# Patient Record
Sex: Male | Born: 1935 | Race: White | Hispanic: No | Marital: Married
Health system: Southern US, Community
[De-identification: ages and names within clinical notes are randomized; demographics above are authoritative.]

---

## 2016-12-09 ENCOUNTER — Inpatient Hospital Stay
Admission: RE | Admit: 2016-12-09 | Discharge: 2017-01-03 | Disposition: A | Discharge status: Hospice | Payer: Self-pay | Source: Ambulatory Visit | Attending: Internal Medicine | Admitting: Internal Medicine

## 2016-12-09 ENCOUNTER — Other Ambulatory Visit (HOSPITAL_COMMUNITY): Payer: Self-pay

## 2016-12-09 DIAGNOSIS — Z431 Encounter for attention to gastrostomy: Secondary | ICD-10-CM

## 2016-12-09 DIAGNOSIS — Z931 Gastrostomy status: Secondary | ICD-10-CM

## 2016-12-09 DIAGNOSIS — Z4659 Encounter for fitting and adjustment of other gastrointestinal appliance and device: Secondary | ICD-10-CM

## 2016-12-09 DIAGNOSIS — L0291 Cutaneous abscess, unspecified: Secondary | ICD-10-CM

## 2016-12-09 DIAGNOSIS — N186 End stage renal disease: Secondary | ICD-10-CM

## 2016-12-09 DIAGNOSIS — Z992 Dependence on renal dialysis: Secondary | ICD-10-CM

## 2016-12-09 DIAGNOSIS — Z452 Encounter for adjustment and management of vascular access device: Secondary | ICD-10-CM

## 2016-12-09 DIAGNOSIS — R140 Abdominal distension (gaseous): Secondary | ICD-10-CM

## 2016-12-09 DIAGNOSIS — K6812 Psoas muscle abscess: Secondary | ICD-10-CM

## 2016-12-09 DIAGNOSIS — R188 Other ascites: Secondary | ICD-10-CM

## 2016-12-09 DIAGNOSIS — J9690 Respiratory failure, unspecified, unspecified whether with hypoxia or hypercapnia: Secondary | ICD-10-CM

## 2016-12-09 DIAGNOSIS — I639 Cerebral infarction, unspecified: Secondary | ICD-10-CM

## 2016-12-09 MED ORDER — IOPAMIDOL (ISOVUE-300) INJECTION 61%
INTRAVENOUS | Status: AC
  Administered 2016-12-09: 50 mL via GASTROSTOMY
  Filled 2016-12-09: qty 50

## 2016-12-10 LAB — CBC WITH DIFFERENTIAL/PLATELET
Basophils Absolute: 0.1 10*3/uL (ref 0.0–0.1)
Basophils Relative: 0 %
EOS ABS: 1.9 10*3/uL — AB (ref 0.0–0.7)
EOS PCT: 12 %
HCT: 30.5 % — ABNORMAL LOW (ref 39.0–52.0)
Hemoglobin: 9.2 g/dL — ABNORMAL LOW (ref 13.0–17.0)
LYMPHS ABS: 1.2 10*3/uL (ref 0.7–4.0)
Lymphocytes Relative: 8 %
MCH: 27.8 pg (ref 26.0–34.0)
MCHC: 30.2 g/dL (ref 30.0–36.0)
MCV: 92.1 fL (ref 78.0–100.0)
MONO ABS: 0.8 10*3/uL (ref 0.1–1.0)
MONOS PCT: 5 %
Neutro Abs: 11.4 10*3/uL — ABNORMAL HIGH (ref 1.7–7.7)
Neutrophils Relative %: 75 %
PLATELETS: 258 10*3/uL (ref 150–400)
RBC: 3.31 MIL/uL — ABNORMAL LOW (ref 4.22–5.81)
RDW: 14.4 % (ref 11.5–15.5)
WBC: 15.4 10*3/uL — ABNORMAL HIGH (ref 4.0–10.5)

## 2016-12-10 LAB — BASIC METABOLIC PANEL
Anion gap: 8 (ref 5–15)
BUN: 25 mg/dL — AB (ref 6–20)
CHLORIDE: 121 mmol/L — AB (ref 101–111)
CO2: 20 mmol/L — ABNORMAL LOW (ref 22–32)
CREATININE: 1.13 mg/dL (ref 0.61–1.24)
Calcium: 8.6 mg/dL — ABNORMAL LOW (ref 8.9–10.3)
GFR calc Af Amer: >60 mL/min (ref >60)
GFR calc non Af Amer: 59 mL/min — ABNORMAL LOW (ref >60)
Glucose, Bld: 146 mg/dL — ABNORMAL HIGH (ref 65–99)
Potassium: 3.2 mmol/L — ABNORMAL LOW (ref 3.5–5.1)
Sodium: 149 mmol/L — ABNORMAL HIGH (ref 135–145)

## 2016-12-11 LAB — CBC
HEMATOCRIT: 35.6 % — AB (ref 39.0–52.0)
Hemoglobin: 10.8 g/dL — ABNORMAL LOW (ref 13.0–17.0)
MCH: 29.1 pg (ref 26.0–34.0)
MCHC: 30.3 g/dL (ref 30.0–36.0)
MCV: 96 fL (ref 78.0–100.0)
Platelets: 258 10*3/uL (ref 150–400)
RBC: 3.71 MIL/uL — ABNORMAL LOW (ref 4.22–5.81)
RDW: 15.1 % (ref 11.5–15.5)
WBC: 18 10*3/uL — AB (ref 4.0–10.5)

## 2016-12-11 LAB — OCCULT BLOOD X 1 CARD TO LAB, STOOL: Fecal Occult Bld: NEGATIVE

## 2016-12-11 LAB — BASIC METABOLIC PANEL
ANION GAP: 9 (ref 5–15)
BUN: 28 mg/dL — ABNORMAL HIGH (ref 6–20)
CALCIUM: 9 mg/dL (ref 8.9–10.3)
CO2: 17 mmol/L — AB (ref 22–32)
Chloride: 121 mmol/L — ABNORMAL HIGH (ref 101–111)
Creatinine, Ser: 1.2 mg/dL (ref 0.61–1.24)
GFR calc Af Amer: >60 mL/min (ref >60)
GFR calc non Af Amer: 54 mL/min — ABNORMAL LOW (ref >60)
GLUCOSE: 140 mg/dL — AB (ref 65–99)
POTASSIUM: 4.7 mmol/L (ref 3.5–5.1)
Sodium: 147 mmol/L — ABNORMAL HIGH (ref 135–145)

## 2016-12-11 LAB — HEMOGLOBIN A1C
HEMOGLOBIN A1C: 7.1 % — AB (ref 4.8–5.6)
Mean Plasma Glucose: 157 mg/dL

## 2016-12-12 LAB — BASIC METABOLIC PANEL
ANION GAP: 8 (ref 5–15)
BUN: 44 mg/dL — ABNORMAL HIGH (ref 6–20)
CALCIUM: 9.1 mg/dL (ref 8.9–10.3)
CO2: 16 mmol/L — AB (ref 22–32)
Chloride: 119 mmol/L — ABNORMAL HIGH (ref 101–111)
Creatinine, Ser: 1.68 mg/dL — ABNORMAL HIGH (ref 0.61–1.24)
GFR, EST AFRICAN AMERICAN: 42 mL/min — AB (ref >60)
GFR, EST NON AFRICAN AMERICAN: 36 mL/min — AB (ref >60)
GLUCOSE: 236 mg/dL — AB (ref 65–99)
POTASSIUM: 4.9 mmol/L (ref 3.5–5.1)
Sodium: 143 mmol/L (ref 135–145)

## 2016-12-12 LAB — CBC
HEMATOCRIT: 33.9 % — AB (ref 39.0–52.0)
HEMOGLOBIN: 10.2 g/dL — AB (ref 13.0–17.0)
MCH: 28.2 pg (ref 26.0–34.0)
MCHC: 30.1 g/dL (ref 30.0–36.0)
MCV: 93.6 fL (ref 78.0–100.0)
Platelets: 249 10*3/uL (ref 150–400)
RBC: 3.62 MIL/uL — AB (ref 4.22–5.81)
RDW: 14.9 % (ref 11.5–15.5)
WBC: 17.8 10*3/uL — ABNORMAL HIGH (ref 4.0–10.5)

## 2016-12-12 LAB — URINALYSIS, ROUTINE W REFLEX MICROSCOPIC
BILIRUBIN URINE: NEGATIVE
Glucose, UA: NEGATIVE mg/dL
KETONES UR: NEGATIVE mg/dL
Nitrite: NEGATIVE
PH: 5 (ref 5.0–8.0)
Protein, ur: 30 mg/dL — AB
Specific Gravity, Urine: 1.02 (ref 1.005–1.030)

## 2016-12-12 LAB — OCCULT BLOOD X 1 CARD TO LAB, STOOL: Fecal Occult Bld: NEGATIVE

## 2016-12-13 ENCOUNTER — Other Ambulatory Visit (HOSPITAL_COMMUNITY): Payer: Self-pay

## 2016-12-13 LAB — BASIC METABOLIC PANEL
Anion gap: 10 (ref 5–15)
BUN: 75 mg/dL — AB (ref 6–20)
CHLORIDE: 122 mmol/L — AB (ref 101–111)
CO2: 13 mmol/L — ABNORMAL LOW (ref 22–32)
Calcium: 9 mg/dL (ref 8.9–10.3)
Creatinine, Ser: 2.5 mg/dL — ABNORMAL HIGH (ref 0.61–1.24)
GFR calc Af Amer: 26 mL/min — ABNORMAL LOW (ref >60)
GFR calc non Af Amer: 22 mL/min — ABNORMAL LOW (ref >60)
Glucose, Bld: 204 mg/dL — ABNORMAL HIGH (ref 65–99)
POTASSIUM: 5.1 mmol/L (ref 3.5–5.1)
SODIUM: 145 mmol/L (ref 135–145)

## 2016-12-13 LAB — CBC
HCT: 33.6 % — ABNORMAL LOW (ref 39.0–52.0)
HEMOGLOBIN: 10.2 g/dL — AB (ref 13.0–17.0)
MCH: 27.9 pg (ref 26.0–34.0)
MCHC: 30.4 g/dL (ref 30.0–36.0)
MCV: 92.1 fL (ref 78.0–100.0)
Platelets: 245 10*3/uL (ref 150–400)
RBC: 3.65 MIL/uL — AB (ref 4.22–5.81)
RDW: 14.7 % (ref 11.5–15.5)
WBC: 16.8 10*3/uL — ABNORMAL HIGH (ref 4.0–10.5)

## 2016-12-13 LAB — PROTIME-INR
INR: 1.45
PROTHROMBIN TIME: 17.8 s — AB (ref 11.4–15.2)

## 2016-12-13 LAB — APTT: aPTT: 28 seconds (ref 24–36)

## 2016-12-13 LAB — URINE CULTURE: Culture: NO GROWTH

## 2016-12-13 LAB — MAGNESIUM: MAGNESIUM: 2.6 mg/dL — AB (ref 1.7–2.4)

## 2016-12-13 LAB — PHOSPHORUS: PHOSPHORUS: 6.9 mg/dL — AB (ref 2.5–4.6)

## 2016-12-13 NOTE — Consult Note (Signed)
Chief Complaint: Patient was seen in consultation today for psoas fluid collection aspiration/drain at the request of Dr Ardeth SportsmanA Hijazi   Supervising Physician: Irish LackYamagata, Glenn  Patient Status: [facility]                             [facility]  History of Present Illness: Patrick Gomez is a 83 y.o. male   Pt was [facility] Duodenal ulcer surgery 11/17/16 and 11/23/16 Duodenostomy; gastrostomy; jejunostomy Has 3 existing surgical abdominal drains in place Has what appears to be a G-J tube in place This tube was clogged and RN tried to use this am----broke tubing while attempting to push fluids into same. Now request for G-J exchange per Dr Sharyon MedicusHijazi  Also noted is new psoas fluid collections CT today:IMPRESSION: 1. Small bilateral retroperitoneal fluid collections adjacent to the psoas muscles. No abscess formation. 2. Percutaneous biliary stent/drain extending from the right upper quadrant MTP effect portion duodenum. 3. Aortic atherosclerosis and sigmoid diverticulosis  Request for psoas collections aspiration/drain Imaging reviewed with Dr Ritta SlotYamagata Approves attempt at aspiration/drain in IR 5/16 (if pt can lay on abdomen---RN says he can if sedated)  Now scheduled for this procedure 5/16  No past medical history on file.  No past surgical history on file.  Allergies: Patient has no allergy information on record.  Medications: Prior to Admission medications   Not on File     No family history on file.  Social History   Social History  . Marital status: Married    Spouse name: N/A  . Number of children: N/A  . Years of education: N/A   Social History Main Topics  . Smoking status: Not on file  . Smokeless tobacco: Not on file  . Alcohol use Not on file  . Drug use: Unknown  . Sexual activity: Not on file   Other Topics Concern  . Not on file   Social History Narrative  . No narrative on file    Review of  Systems: A 12 point ROS discussed and pertinent positives are indicated in the HPI above.  All other systems are negative.  Review of Systems  Constitutional:       Ill appearing Non responsive    Vital Signs: There were no vitals taken for this visit.  Physical Exam  Cardiovascular: Normal rate and regular rhythm.   Pulmonary/Chest: Effort normal and breath sounds normal.  Abdominal: Soft. There is tenderness.  3 existing surgical drains intact g-j tube in place- broken but clamped and secure  Musculoskeletal:  Moves all 4s- not to command   Skin: Skin is warm.  Psychiatric:  Consented wife via phone  Nursing note and vitals reviewed.   Mallampati Score:  MD Evaluation Airway: WNL Heart: WNL Abdomen: WNL Chest/ Lungs: WNL ASA  Classification: 3 Mallampati/Airway Score: Two  Imaging: Ct Abdomen Pelvis Wo Contrast  Result Date: 12/13/2016 CLINICAL DATA:  Evaluation for abscess EXAM: CT ABDOMEN AND PELVIS WITHOUT CONTRAST TECHNIQUE: Multidetector CT imaging of the abdomen and pelvis was performed following the standard protocol without IV contrast. COMPARISON:  None. FINDINGS: Lower chest: No pulmonary nodules. No visible pleural or pericardial effusion. Hepatobiliary: There is a percutaneous biliary stent entering from a right upper quadrant approach with its tip in the second part of the duodenum. The gallbladder is absent. Pancreas: Normal pancreatic contours. No peripancreatic fluid collection or pancreatic ductal dilatation. Spleen: Normal. Adrenals/Urinary  Tract: Normal adrenal glands. No hydronephrosis or nephrolithiasis. No abnormal perinephric stranding. No ureteral obstruction. Stomach/Bowel: There is no hiatal hernia. The stomach and duodenum are normal. There is no dilated small bowel or enteric inflammation. There is diverticulosis of the sigmoid colon. The appendix is normal. Vascular/Lymphatic: There is aortic atherosclerosis. No abdominal or pelvic adenopathy.  Reproductive: Foley catheter in place. Musculoskeletal: Right total hip arthroplasty. Severe lower lumbar facet arthrosis and multilevel thoracolumbar osteophytosis. There is a midline anterior surgical wound. Other: There are bilateral fluid collections within the retroperitoneum, lateral to the psoas muscles. On the right, the collection measures 8.4 x 2.2 by 7.8 cm. On the left the collection measures 8.8 x 3.3 x 4.8 cm. The density is that of simple fluid. There is no apparent wall. IMPRESSION: 1. Small bilateral retroperitoneal fluid collections adjacent to the psoas muscles. No abscess formation. 2. Percutaneous biliary stent/drain extending from the right upper quadrant MTP effect portion duodenum. 3. Aortic atherosclerosis and sigmoid diverticulosis. Electronically Signed   By: Deatra Robinson M.D.   On: 12/13/2016 06:16   Dg Abdomen Peg Tube Location  Result Date: 12/09/2016 CLINICAL DATA:  Post percutaneous gastrostomy tube placement. EXAM: ABDOMEN - 1 VIEW. 50 mL Isovue-300 given through the gastrostomy tube followed by 30 mL of water. COMPARISON:  None. FINDINGS: Examination demonstrates evidence of patient's percutaneous gastrostomy tube with tip over the left upper quadrant. Contrast is present within the small bowel and colon. No evidence of contrast extravasation. Bowel gas pattern is nonobstructive. There are moderate degenerative changes of the spine. IMPRESSION: Nonobstructive bowel gas pattern. Gastrostomy tube with tip over the left upper quadrant likely over the stomach with contrast present throughout the small bowel and colon. Electronically Signed   By: Elberta Fortis M.D.   On: 12/09/2016 17:40   Dg Chest Port 1 View  Result Date: 12/09/2016 CLINICAL DATA:  Percutaneous gastrostomy tube placement. EXAM: PORTABLE CHEST 1 VIEW COMPARISON:  None. FINDINGS: Left-sided PICC line is present with tip just above the cavoatrial junction. Lungs are adequately inflated without focal airspace  consolidation or effusion. There is mild cardiomegaly. There is calcified plaque over the thoracic aorta. There are degenerative changes of the spine. IMPRESSION: No acute cardiopulmonary disease. Left-sided PICC line in adequate position. Electronically Signed   By: Elberta Fortis M.D.   On: 12/09/2016 17:38    Labs:  CBC:  Recent Labs  12/10/16 0721 12/11/16 1313 12/12/16 0859  WBC 15.4* 18.0* 17.8*  HGB 9.2* 10.8* 10.2*  HCT 30.5* 35.6* 33.9*  PLT 258 258 249    COAGS: No results for input(s): INR, APTT in the last 8760 hours.  BMP:  Recent Labs  12/10/16 0721 12/11/16 1424 12/12/16 0859  NA 149* 147* 143  K 3.2* 4.7 4.9  CL 121* 121* 119*  CO2 20* 17* 16*  GLUCOSE 146* 140* 236*  BUN 25* 28* 44*  CALCIUM 8.6* 9.0 9.1  CREATININE 1.13 1.20 1.68*  GFRNONAA 59* 54* 36*  GFRAA >60 >60 42*    LIVER FUNCTION TESTS: No results for input(s): BILITOT, AST, ALT, ALKPHOS, PROT, ALBUMIN in the last 8760 hours.  TUMOR MARKERS: No results for input(s): AFPTM, CEA, CA199, CHROMGRNA in the last 8760 hours.  Assessment and Plan:  For G-J tube exchange today Planned for possible psoas collection aspiration/drain 5/16 Risks and Benefits discussed with the patient's wife via phone including bleeding, infection, damage to adjacent structures, bowel perforation/fistula connection, and sepsis. All of herquestions were answered, she is agreeable to  proceed. Consent signed and in chart.   Thank you for this interesting consult.  I greatly enjoyed meeting Farley Crooker and look forward to participating in their care.  A copy of this report was sent to the requesting provider on this date.  Electronically Signed: Romero Letizia A 12/13/2016, 10:10 AM   I spent a total of 40 Minutes    in face to face in clinical consultation, greater than 50% of which was counseling/coordinating care for psaos collection aspiration/drain; GJ tube exchange

## 2016-12-14 ENCOUNTER — Other Ambulatory Visit (HOSPITAL_COMMUNITY): Payer: Self-pay

## 2016-12-14 LAB — EXPECTORATED SPUTUM ASSESSMENT W GRAM STAIN, RFLX TO RESP C

## 2016-12-14 LAB — RENAL FUNCTION PANEL
ALBUMIN: 2 g/dL — AB (ref 3.5–5.0)
ANION GAP: 11 (ref 5–15)
Albumin: 1.9 g/dL — ABNORMAL LOW (ref 3.5–5.0)
Anion gap: 11 (ref 5–15)
BUN: 95 mg/dL — ABNORMAL HIGH (ref 6–20)
BUN: 92 mg/dL — ABNORMAL HIGH (ref 6–20)
CHLORIDE: 124 mmol/L — AB (ref 101–111)
CO2: 8 mmol/L — ABNORMAL LOW (ref 22–32)
CO2: 10 mmol/L — ABNORMAL LOW (ref 22–32)
CREATININE: 3.66 mg/dL — AB (ref 0.61–1.24)
Calcium: 8.4 mg/dL — ABNORMAL LOW (ref 8.9–10.3)
Calcium: 8.7 mg/dL — ABNORMAL LOW (ref 8.9–10.3)
Chloride: 124 mmol/L — ABNORMAL HIGH (ref 101–111)
Creatinine, Ser: 3.68 mg/dL — ABNORMAL HIGH (ref 0.61–1.24)
GFR calc Af Amer: 16 mL/min — ABNORMAL LOW (ref >60)
GFR, EST AFRICAN AMERICAN: 16 mL/min — AB (ref >60)
GFR, EST NON AFRICAN AMERICAN: 14 mL/min — AB (ref >60)
GFR, EST NON AFRICAN AMERICAN: 14 mL/min — AB (ref >60)
Glucose, Bld: 207 mg/dL — ABNORMAL HIGH (ref 65–99)
Glucose, Bld: 134 mg/dL — ABNORMAL HIGH (ref 65–99)
POTASSIUM: 6.8 mmol/L — AB (ref 3.5–5.1)
Phosphorus: 8.7 mg/dL — ABNORMAL HIGH (ref 2.5–4.6)
Phosphorus: 8.7 mg/dL — ABNORMAL HIGH (ref 2.5–4.6)
Potassium: 5.5 mmol/L — ABNORMAL HIGH (ref 3.5–5.1)
SODIUM: 143 mmol/L (ref 135–145)
Sodium: 145 mmol/L (ref 135–145)

## 2016-12-14 LAB — CBC
HEMATOCRIT: 32 % — AB (ref 39.0–52.0)
HEMOGLOBIN: 9.8 g/dL — AB (ref 13.0–17.0)
MCH: 28.3 pg (ref 26.0–34.0)
MCHC: 30.6 g/dL (ref 30.0–36.0)
MCV: 92.5 fL (ref 78.0–100.0)
Platelets: 222 10*3/uL (ref 150–400)
RBC: 3.46 MIL/uL — AB (ref 4.22–5.81)
RDW: 15.4 % (ref 11.5–15.5)
WBC: 12.2 10*3/uL — ABNORMAL HIGH (ref 4.0–10.5)

## 2016-12-14 LAB — BLOOD GAS, ARTERIAL
ACID-BASE DEFICIT: 18.4 mmol/L — AB (ref 0.0–2.0)
Bicarbonate: 7.8 mmol/L — ABNORMAL LOW (ref 20.0–28.0)
DRAWN BY: 270221
FIO2: 0.21
O2 SAT: 95.7 %
PH ART: 7.234 — AB (ref 7.350–7.450)
Patient temperature: 98.6
pCO2 arterial: 19.2 mmHg — CL (ref 32.0–48.0)
pO2, Arterial: 91.7 mmHg (ref 83.0–108.0)

## 2016-12-14 LAB — DIFFERENTIAL
BASOS PCT: 1 %
Basophils Absolute: 0.1 10*3/uL (ref 0.0–0.1)
EOS ABS: 1.2 10*3/uL — AB (ref 0.0–0.7)
EOS PCT: 10 %
LYMPHS PCT: 12 %
Lymphs Abs: 1.5 10*3/uL (ref 0.7–4.0)
MONOS PCT: 5 %
Monocytes Absolute: 0.6 10*3/uL (ref 0.1–1.0)
NEUTROS PCT: 72 %
Neutro Abs: 8.8 10*3/uL — ABNORMAL HIGH (ref 1.7–7.7)

## 2016-12-14 LAB — MAGNESIUM
Magnesium: 2.7 mg/dL — ABNORMAL HIGH (ref 1.7–2.4)
Magnesium: 2.5 mg/dL — ABNORMAL HIGH (ref 1.7–2.4)

## 2016-12-14 LAB — LACTIC ACID, PLASMA: LACTIC ACID, VENOUS: 1.3 mmol/L (ref 0.5–1.9)

## 2016-12-14 LAB — EXPECTORATED SPUTUM ASSESSMENT W REFEX TO RESP CULTURE

## 2016-12-14 LAB — POTASSIUM: Potassium: 5.7 mmol/L — ABNORMAL HIGH (ref 3.5–5.1)

## 2016-12-14 NOTE — Consult Note (Signed)
Date: 12/14/2016               Patient Name:  Patrick Gomez MRN: [id number]  DOB: 02/02/1933 Age / Sex: 83 y.o., male        PCP: System, Pcp Not In              Service Requesting Consult: Hospitalist              Reason for Consult: ARF       History of Present Illness: Patient is a 83 y.o. male with medical problems of coronary disease, COPD, diabetes hypertension, hyperlipidemia, BPH, peptic ulcer disease, who was admitted to [facility] on 12/09/2016 for ongoing postsurgical care. Patient presented to hospital and states [facility] for abdominal pain. He underwent exploratory laparotomy on 4/21 during which duodenal perforation was found. Repeat surgery was done on 4/20 for the head and additional drain placements.Other hospital complications include postoperative respiratory failure requiring intubation for a few days. Patient is currently extubated. He required TPN and subsequent parenteral  was placed. He has multiple drains in the abdomen. Since admission, his creatinine has been progressively worsening. Today's level has increased to 3.66, BUN 92 Potassium level at 5.5. Repeat level was high at 5.7 Patient also developed acidosis with a bicarbonate level of 10 Nephrology consult has now been requested to evaluate acute renal failure   Medications: Outpatient medications: No prescriptions prior to admission.    Current medications: No current facility-administered medications for this encounter.     D5 half-normal saline Fluconazole Metronidazole IV Famotidine Fentanyl patch Lantus subcutaneous Metoprolol  Allergies: Allergies not on file  PCN, Latex   Past Medical History: No past medical history on file. See below  Past Surgical History: No past surgical history on file.   Family History: No family history on file.   Social History: patient is married. He used to be a Medical illustrator. Social History   Social History  . Marital status: Married    Spouse  name: N/A  . Number of children: N/A  . Years of education: N/A   Occupational History  . Not on file.   Social History Main Topics  . Smoking status: Not on file  . Smokeless tobacco: Not on file  . Alcohol use Not on file  . Drug use: Unknown  . Sexual activity: Not on file   Other Topics Concern  . Not on file   Social History Narrative  . No narrative on file     Review of Systems:not available Gen:  HEENT:  CV:  Resp:  GI: GU :  MS:  Derm:   Psych: Heme:  Neuro:  Endocrine  Vital Signs:  Temperature 97.1, pulse 88, respirations 23, blood pressure 90/46 Urine output 240 cc, drain output 1300 cc Input 2700  No intake or output data in the 24 hours ending 12/14/16 1610  Weight trends: There were no vitals filed for this visit.  Physical Exam: General:  critically ill appearing, laying in the bed  HEENT dryl mucous membranes  Neck: supple  Lungs: Normal breathing effort, no crackles or wheezes  Heart:: regular, tachycardic  Abdomen: Multiple drains in place, soft   Extremities: Heels in soft support, no peripheral edema  Neurologic: Lethargic, eyes open, moaning, not able to answer any questions  Skin: Decreased turgor  Access:   Foley: present       Lab results: Basic Metabolic Panel:  Recent Labs Lab 12/13/16 1007 12/14/16 0804 12/14/16 1039 12/14/16 1426  NA  145 143 145  --   K 5.1 6.8* 5.5* 5.7*  CL 122* 124* 124*  --   CO2 13* 8* 10*  --   GLUCOSE 204* 207* 134*  --   BUN 75* 95* 92*  --   CREATININE 2.50* 3.68* 3.66*  --   CALCIUM 9.0 8.4* 8.7*  --   MG 2.6* 2.7* 2.5*  --   PHOS 6.9* 8.7* 8.7*  --     Liver Function Tests:  Recent Labs Lab 12/14/16 1039  ALBUMIN 1.9*   No results for input(s): LIPASE, AMYLASE in the last 168 hours. No results for input(s): AMMONIA in the last 168 hours.  CBC:  Recent Labs Lab 12/10/16 0721  12/13/16 1007 12/14/16 0846  WBC 15.4*  < > 16.8* 12.2*  NEUTROABS 11.4*  --   --   8.8*  HGB 9.2*  < > 10.2* 9.8*  HCT 30.5*  < > 33.6* 32.0*  MCV 92.1  < > 92.1 92.5  PLT 258  < > 245 222  < > = values in this interval not displayed.  Cardiac Enzymes: No results for input(s): CKTOTAL, TROPONINI in the last 168 hours.  BNP: Invalid input(s): POCBNP  CBG: No results for input(s): GLUCAP in the last 168 hours.  Microbiology: Recent Results (from the past 720 hour(s))  Culture, Urine     Status: None   Collection Time: 12/12/16  3:00 PM  Result Value Ref Range Status   Specimen Description URINE, RANDOM  Final   Special Requests NONE  Final   Culture NO GROWTH  Final   Report Status 12/13/2016 FINAL  Final  Culture, blood (routine x 2)     Status: None (Preliminary result)   Collection Time: 12/12/16  3:23 PM  Result Value Ref Range Status   Specimen Description BLOOD LEFT ANTECUBITAL  Final   Special Requests IN PEDIATRIC BOTTLE Blood Culture adequate volume  Final   Culture NO GROWTH 2 DAYS  Final   Report Status PENDING  Incomplete  Culture, blood (routine x 2)     Status: None (Preliminary result)   Collection Time: 12/12/16  3:25 PM  Result Value Ref Range Status   Specimen Description BLOOD LEFT HAND  Final   Special Requests IN PEDIATRIC BOTTLE Blood Culture adequate volume  Final   Culture NO GROWTH 2 DAYS  Final   Report Status PENDING  Incomplete     Coagulation Studies:  Recent Labs  12/13/16 1007  LABPROT 17.8*  INR 1.45    Urinalysis:  Recent Labs  12/12/16 1500  COLORURINE AMBER*  LABSPEC 1.020  PHURINE 5.0  GLUCOSEU NEGATIVE  HGBUR LARGE*  BILIRUBINUR NEGATIVE  KETONESUR NEGATIVE  PROTEINUR 30*  NITRITE NEGATIVE  LEUKOCYTESUR TRACE*        Imaging: Ct Abdomen Pelvis Wo Contrast  Result Date: 12/13/2016 CLINICAL DATA:  Evaluation for abscess EXAM: CT ABDOMEN AND PELVIS WITHOUT CONTRAST TECHNIQUE: Multidetector CT imaging of the abdomen and pelvis was performed following the standard protocol without IV contrast.  COMPARISON:  None. FINDINGS: Lower chest: No pulmonary nodules. No visible pleural or pericardial effusion. Hepatobiliary: There is a percutaneous biliary stent entering from a right upper quadrant approach with its tip in the second part of the duodenum. The gallbladder is absent. Pancreas: Normal pancreatic contours. No peripancreatic fluid collection or pancreatic ductal dilatation. Spleen: Normal. Adrenals/Urinary Tract: Normal adrenal glands. No hydronephrosis or nephrolithiasis. No abnormal perinephric stranding. No ureteral obstruction. Stomach/Bowel: There is no hiatal hernia. The  stomach and duodenum are normal. There is no dilated small bowel or enteric inflammation. There is diverticulosis of the sigmoid colon. The appendix is normal. Vascular/Lymphatic: There is aortic atherosclerosis. No abdominal or pelvic adenopathy. Reproductive: Foley catheter in place. Musculoskeletal: Right total hip arthroplasty. Severe lower lumbar facet arthrosis and multilevel thoracolumbar osteophytosis. There is a midline anterior surgical wound. Other: There are bilateral fluid collections within the retroperitoneum, lateral to the psoas muscles. On the right, the collection measures 8.4 x 2.2 by 7.8 cm. On the left the collection measures 8.8 x 3.3 x 4.8 cm. The density is that of simple fluid. There is no apparent wall. IMPRESSION: 1. Small bilateral retroperitoneal fluid collections adjacent to the psoas muscles. No abscess formation. 2. Percutaneous biliary stent/drain extending from the right upper quadrant MTP effect portion duodenum. 3. Aortic atherosclerosis and sigmoid diverticulosis. Electronically Signed   By: Deatra Robinson M.D.   On: 12/13/2016 06:16      Assessment & Plan: Pt is a 83 y.o. caucasian male with history of coronary disease, left circumflex stent in 2007, COPD, diabetes type 2 insulin-dependent, hypertension, hyperlipidemia, BPH, peptic ulcer disease, right hip surgery, cholecystectomy, hernia  repair, was admitted on 12/09/2016 for ongoing postsurgical care. Patient underwent ex - laparotomy for perforated duodenal ulcer 4/21 and repair of leak on 4/23. Hospital course complicated by acute respiratory failure requiring intubation, now extubated,  1. ARF, oliguric ATN likely from sepsis probably exacerbated by intravascular volume depletion Patient has developed severe acidosis with a bicarbonate of 10, azotemia with BUN of 92, creatinine of 2.66, hypertension anemia. This case was discussed with patient's wife. She desires aggressive care since patient was independent in his activities of daily living prior to admission.  We will request temporary dialysis catheter in place the vascular radiology. Consult has been placed. Dialysis planned for tomorrow; once access is available Hemodialysis procedure was discussed with patient's wife. Risks, benefits, alternatives were discussed. She has consented to proceed.   2. Hyperkalemia Medically managed Dialysis planned for tomorrow Check CK level  3. Acidosis Expected to correct with hemodialysis  4. Status post exploratory laparotomy for perforated duodenal ulcer

## 2016-12-14 NOTE — Progress Notes (Signed)
Patient planning for procedure today in IR, however his potassium was found to be elevated and has not improved.  Potassium now 5.7. Discussed with Dr. Deanne CofferHassell.  Continue efforts by medical team to correct electrolyte imbalance.  Will plan for IR procedures tomorrow pending improvement in labs.  CMP reordered for tomorrow morning.  Patient made NPO after midnight.   Loyce DysKacie Matthews, MMS RDN PA-C 3:47 PM

## 2016-12-15 ENCOUNTER — Other Ambulatory Visit (HOSPITAL_COMMUNITY): Payer: Self-pay

## 2016-12-15 ENCOUNTER — Encounter (HOSPITAL_COMMUNITY): Payer: Self-pay | Admitting: Interventional Radiology

## 2016-12-15 HISTORY — PX: IR US GUIDE VASC ACCESS LEFT: IMG2389

## 2016-12-15 HISTORY — PX: IR REPLC DUODEN/JEJUNO TUBE PERCUT W/FLUORO: IMG2334

## 2016-12-15 HISTORY — PX: IR FLUORO GUIDE CV LINE LEFT: IMG2282

## 2016-12-15 LAB — CBC WITH DIFFERENTIAL/PLATELET
BASOS ABS: 0 10*3/uL (ref 0.0–0.1)
Basophils Relative: 0 %
EOS PCT: 4 %
Eosinophils Absolute: 0.5 10*3/uL (ref 0.0–0.7)
HEMATOCRIT: 30.9 % — AB (ref 39.0–52.0)
HEMOGLOBIN: 9.5 g/dL — AB (ref 13.0–17.0)
LYMPHS PCT: 10 %
Lymphs Abs: 1.3 10*3/uL (ref 0.7–4.0)
MCH: 27.7 pg (ref 26.0–34.0)
MCHC: 30.7 g/dL (ref 30.0–36.0)
MCV: 90.1 fL (ref 78.0–100.0)
MONOS PCT: 6 %
Monocytes Absolute: 0.8 10*3/uL (ref 0.1–1.0)
NEUTROS PCT: 80 %
Neutro Abs: 10.6 10*3/uL — ABNORMAL HIGH (ref 1.7–7.7)
Platelets: 214 10*3/uL (ref 150–400)
RBC: 3.43 MIL/uL — AB (ref 4.22–5.81)
RDW: 15.1 % (ref 11.5–15.5)
WBC: 13.2 10*3/uL — AB (ref 4.0–10.5)

## 2016-12-15 LAB — RENAL FUNCTION PANEL
ALBUMIN: 1.9 g/dL — AB (ref 3.5–5.0)
ANION GAP: 16 — AB (ref 5–15)
BUN: 97 mg/dL — ABNORMAL HIGH (ref 6–20)
CALCIUM: 8.7 mg/dL — AB (ref 8.9–10.3)
CO2: 10 mmol/L — ABNORMAL LOW (ref 22–32)
Chloride: 119 mmol/L — ABNORMAL HIGH (ref 101–111)
Creatinine, Ser: 4.67 mg/dL — ABNORMAL HIGH (ref 0.61–1.24)
GFR calc non Af Amer: 11 mL/min — ABNORMAL LOW (ref >60)
GFR, EST AFRICAN AMERICAN: 12 mL/min — AB (ref >60)
GLUCOSE: 101 mg/dL — AB (ref 65–99)
PHOSPHORUS: 8.8 mg/dL — AB (ref 2.5–4.6)
POTASSIUM: 5.7 mmol/L — AB (ref 3.5–5.1)
SODIUM: 145 mmol/L (ref 135–145)

## 2016-12-15 LAB — COMPREHENSIVE METABOLIC PANEL
ALBUMIN: 1.9 g/dL — AB (ref 3.5–5.0)
ALK PHOS: 83 U/L (ref 38–126)
ALT: 16 U/L — ABNORMAL LOW (ref 17–63)
ANION GAP: 16 — AB (ref 5–15)
AST: 22 U/L (ref 15–41)
BILIRUBIN TOTAL: 0.7 mg/dL (ref 0.3–1.2)
BUN: 98 mg/dL — AB (ref 6–20)
CALCIUM: 8.6 mg/dL — AB (ref 8.9–10.3)
CO2: 10 mmol/L — AB (ref 22–32)
Chloride: 119 mmol/L — ABNORMAL HIGH (ref 101–111)
Creatinine, Ser: 4.7 mg/dL — ABNORMAL HIGH (ref 0.61–1.24)
GFR calc Af Amer: 12 mL/min — ABNORMAL LOW (ref >60)
GFR calc non Af Amer: 10 mL/min — ABNORMAL LOW (ref >60)
GLUCOSE: 98 mg/dL (ref 65–99)
Potassium: 5.6 mmol/L — ABNORMAL HIGH (ref 3.5–5.1)
SODIUM: 145 mmol/L (ref 135–145)
TOTAL PROTEIN: 6.4 g/dL — AB (ref 6.5–8.1)

## 2016-12-15 LAB — MAGNESIUM: Magnesium: 2.2 mg/dL (ref 1.7–2.4)

## 2016-12-15 LAB — CK: Total CK: 71 U/L (ref 49–397)

## 2016-12-15 LAB — PHOSPHORUS: Phosphorus: 8.8 mg/dL — ABNORMAL HIGH (ref 2.5–4.6)

## 2016-12-15 MED ORDER — HEPARIN SODIUM (PORCINE) 1000 UNIT/ML IJ SOLN
INTRAMUSCULAR | Status: AC
  Filled 2016-12-15: qty 1

## 2016-12-15 MED ORDER — IOPAMIDOL (ISOVUE-300) INJECTION 61%
INTRAVENOUS | Status: AC
  Administered 2016-12-15: 20 mL
  Filled 2016-12-15: qty 50

## 2016-12-15 MED ORDER — LIDOCAINE HCL 1 % IJ SOLN
INTRAMUSCULAR | Status: AC | PRN
  Administered 2016-12-15: 10 mL

## 2016-12-15 MED ORDER — LIDOCAINE HCL 1 % IJ SOLN
INTRAMUSCULAR | Status: AC
  Filled 2016-12-15: qty 20

## 2016-12-15 MED ORDER — HEPARIN SODIUM (PORCINE) 1000 UNIT/ML IJ SOLN
INTRAMUSCULAR | Status: AC | PRN
  Administered 2016-12-15: 1000 [IU] via INTRAVENOUS

## 2016-12-15 NOTE — Procedures (Signed)
Interventional Radiology Procedure Note  Procedure:  Left IJ non-tunneled HD cath; replacement of jejunostomy catheter   Complications: None  Estimated Blood Loss: < 10 mL  24 cm non-tunneled HD cath via left IJ: Tip at SVC/RA junction.  OK to use.  New 16 Fr red rubber jejunostomy catheter placed over wire.  Tip in jejunum.  OK to use.  Jodi MarbleGlenn T. Fredia SorrowYamagata, M.D Pager:  [phone]

## 2016-12-16 LAB — MAGNESIUM: Magnesium: 2 mg/dL (ref 1.7–2.4)

## 2016-12-16 LAB — RENAL FUNCTION PANEL
ALBUMIN: 1.8 g/dL — AB (ref 3.5–5.0)
Anion gap: 18 — ABNORMAL HIGH (ref 5–15)
BUN: 91 mg/dL — AB (ref 6–20)
CHLORIDE: 116 mmol/L — AB (ref 101–111)
CO2: 10 mmol/L — ABNORMAL LOW (ref 22–32)
Calcium: 8 mg/dL — ABNORMAL LOW (ref 8.9–10.3)
Creatinine, Ser: 5.05 mg/dL — ABNORMAL HIGH (ref 0.61–1.24)
GFR calc Af Amer: 11 mL/min — ABNORMAL LOW (ref >60)
GFR calc non Af Amer: 10 mL/min — ABNORMAL LOW (ref >60)
GLUCOSE: 176 mg/dL — AB (ref 65–99)
POTASSIUM: 4.2 mmol/L (ref 3.5–5.1)
Phosphorus: 8.7 mg/dL — ABNORMAL HIGH (ref 2.5–4.6)
Sodium: 144 mmol/L (ref 135–145)

## 2016-12-16 LAB — CBC WITH DIFFERENTIAL/PLATELET
Basophils Absolute: 0 10*3/uL (ref 0.0–0.1)
Basophils Relative: 0 %
EOS ABS: 0.3 10*3/uL (ref 0.0–0.7)
Eosinophils Relative: 4 %
HEMATOCRIT: 28.3 % — AB (ref 39.0–52.0)
Hemoglobin: 9.2 g/dL — ABNORMAL LOW (ref 13.0–17.0)
LYMPHS PCT: 10 %
Lymphs Abs: 0.8 10*3/uL (ref 0.7–4.0)
MCH: 28.4 pg (ref 26.0–34.0)
MCHC: 32.5 g/dL (ref 30.0–36.0)
MCV: 87.3 fL (ref 78.0–100.0)
Monocytes Absolute: 0.5 10*3/uL (ref 0.1–1.0)
Monocytes Relative: 6 %
Neutro Abs: 6.3 10*3/uL (ref 1.7–7.7)
Neutrophils Relative %: 80 %
PLATELETS: 195 10*3/uL (ref 150–400)
RBC: 3.24 MIL/uL — AB (ref 4.22–5.81)
RDW: 15.3 % (ref 11.5–15.5)
WBC: 7.9 10*3/uL (ref 4.0–10.5)

## 2016-12-16 LAB — HEPATITIS B SURFACE ANTIBODY, QUANTITATIVE: Hepatitis B-Post: <3.1 m[IU]/mL — ABNORMAL LOW (ref >9.9)

## 2016-12-16 LAB — HEPATITIS B CORE ANTIBODY, TOTAL: HEP B C TOTAL AB: NEGATIVE

## 2016-12-16 LAB — HEPATITIS B SURFACE ANTIGEN: HEP B S AG: NEGATIVE

## 2016-12-16 NOTE — Progress Notes (Signed)
[facility] [place], [place] 12/16/16  Subjective:   Patient underwent HD yesterday. Tolerated well Remains critically ill. Levophed at 5 Left IJ temp cath placed by VIR yesterday Wet cough  Objective:  Vital signs in last 24 hours:  Resp:  [24-29] 29 (05/17 1400) BP: (109-124)/(56-61) 123/57 (05/17 1400) SpO2:  [97 %-98 %] 97 % (05/17 1400)  emperature 99.9, blood pressure 105/51, pulse 102, respirations 31, saturations 97 to  Intake/Output:   No intake or output data in the 24 hours ending 12/16/16 0948   Physical Exam: General: NAD, laying in bed  HEENT Eyes closed. Did not open mouth  Neck Left IJ temp dialysis cathter  Pulm/lungs Coarse b/l, room air  CVS/Heart Regular, tachycardic  Abdomen:  Multiple drains in place, Dressing in Place., Foley cathter in place  Extremities: + heels in soft support, trace edema  Neurologic: Not following commands  Skin: Scattered bruises  Access: left IJ temporary doses catheter       Basic Metabolic Panel:   Recent Labs Lab 12/13/16 1007 12/14/16 0804 12/14/16 1039 12/14/16 1426 12/15/16 0509 12/16/16 0743  NA 145 143 145  --  145  145 144  K 5.1 6.8* 5.5* 5.7* 5.6*  5.7* 4.2  CL 122* 124* 124*  --  119*  119* 116*  CO2 13* 8* 10*  --  10*  10* 10*  GLUCOSE 204* 207* 134*  --  98  101* 176*  BUN 75* 95* 92*  --  98*  97* 91*  CREATININE 2.50* 3.68* 3.66*  --  4.70*  4.67* 5.05*  CALCIUM 9.0 8.4* 8.7*  --  8.6*  8.7* 8.0*  MG 2.6* 2.7* 2.5*  --  2.2 2.0  PHOS 6.9* 8.7* 8.7*  --  8.8*  8.8* 8.7*     CBC:  Recent Labs Lab 12/10/16 0721  12/12/16 0859 12/13/16 1007 12/14/16 0846 12/15/16 0509 12/16/16 0743  WBC 15.4*  < > 17.8* 16.8* 12.2* 13.2* 7.9  NEUTROABS 11.4*  --   --   --  8.8* 10.6* PENDING  HGB 9.2*  < > 10.2* 10.2* 9.8* 9.5* 9.2*  HCT 30.5*  < > 33.9* 33.6* 32.0* 30.9* 28.3*  MCV 92.1  < > 93.6 92.1 92.5 90.1 87.3  PLT 258  < > 249 245 222 214 195  < > = values in  this interval not displayed.   Lab Results  Component Value Date   HEPBSAG Negative 12/15/2016      Microbiology:  Recent Results (from the past 240 hour(s))  Culture, Urine     Status: None   Collection Time: 12/12/16  3:00 PM  Result Value Ref Range Status   Specimen Description URINE, RANDOM  Final   Special Requests NONE  Final   Culture NO GROWTH  Final   Report Status 12/13/2016 FINAL  Final  Culture, blood (routine x 2)     Status: None (Preliminary result)   Collection Time: 12/12/16  3:23 PM  Result Value Ref Range Status   Specimen Description BLOOD LEFT ANTECUBITAL  Final   Special Requests IN PEDIATRIC BOTTLE Blood Culture adequate volume  Final   Culture NO GROWTH 3 DAYS  Final   Report Status PENDING  Incomplete  Culture, blood (routine x 2)     Status: None (Preliminary result)   Collection Time: 12/12/16  3:25 PM  Result Value Ref Range Status   Specimen Description BLOOD LEFT HAND  Final   Special Requests IN PEDIATRIC BOTTLE  Blood Culture adequate volume  Final   Culture NO GROWTH 3 DAYS  Final   Report Status PENDING  Incomplete  Culture, expectorated sputum-assessment     Status: None   Collection Time: 12/14/16  2:05 PM  Result Value Ref Range Status   Specimen Description SPUTUM  Final   Special Requests NONE  Final   Sputum evaluation THIS SPECIMEN IS ACCEPTABLE FOR SPUTUM CULTURE  Final   Report Status 12/14/2016 FINAL  Final  Culture, respiratory (NON-Expectorated)     Status: None (Preliminary result)   Collection Time: 12/14/16  2:05 PM  Result Value Ref Range Status   Specimen Description SPUTUM  Final   Special Requests NONE Reflexed from [id number]  Final   Gram Stain   Final    ABUNDANT WBC PRESENT,BOTH PMN AND MONONUCLEAR ABUNDANT GRAM POSITIVE COCCI IN PAIRS FEW GRAM NEGATIVE RODS    Culture ABUNDANT STAPHYLOCOCCUS AUREUS  Final   Report Status PENDING  Incomplete    Coagulation Studies:  Recent Labs  12/13/16 1007  LABPROT 17.8*   INR 1.45    Urinalysis: No results for input(s): COLORURINE, LABSPEC, PHURINE, GLUCOSEU, HGBUR, BILIRUBINUR, KETONESUR, PROTEINUR, UROBILINOGEN, NITRITE, LEUKOCYTESUR in the last 72 hours.  Invalid input(s): APPERANCEUR    Imaging: Ct Head Wo Contrast  Result Date: 12/15/2016 CLINICAL DATA:  Flaccid left and right arms EXAM: CT HEAD WITHOUT CONTRAST TECHNIQUE: Contiguous axial images were obtained from the base of the skull through the vertex without intravenous contrast. COMPARISON:  None. FINDINGS: Brain: There is hypoattenuation within both occipital lobes. No intracranial hemorrhage. No midline shift or other mass effect. No extra-axial collection. There is periventricular hypoattenuation compatible with chronic microvascular disease. Vascular: Atherosclerotic calcification of the internal carotid arteries at the skull base. Skull: Normal visualized skull base, calvarium and extracranial soft tissues. Sinuses/Orbits: No sinus fluid levels or advanced mucosal thickening. No mastoid effusion. Normal orbits. IMPRESSION: Focal areas of hypoattenuation within both occipital lobes. The findings are suggestive of posterior reversible encephalopathy syndrome (PRES). MRI is recommended for better characterization. Electronically Signed   By: Deatra Robinson M.D.   On: 12/15/2016 01:48   Ir Fluoro Guide Cv Line Left  Result Date: 12/15/2016 CLINICAL DATA:  Renal failure and hyperkalemia. Need for non tunneled hemodialysis catheter. Damaged indwelling percutaneous jejunostomy catheter requires replacement. EXAM: 1. NON-TUNNELED CENTRAL VENOUS CATHETER PLACEMENT WITH ULTRASOUND AND FLUOROSCOPIC GUIDANCE 2. REPLACEMENT OF PERCUTANEOUS JEJUNOSTOMY CATHETER FLUOROSCOPY TIME:  1 minutes and 24 seconds.  10.0 mGy. PROCEDURE: The procedure, risks, benefits, and alternatives were explained to the patient's wife. Questions regarding the procedure were encouraged and answered. The patient's wife understands and  consents to the procedure. A time-out was performed prior to initiating the procedure. The left neck and chest were prepped with chlorhexidine in a sterile fashion, and a sterile drape was applied covering the operative field. Maximum barrier sterile technique with sterile gowns and gloves were used for the procedure. Local anesthesia was provided with 1% lidocaine. After creating a small venotomy incision, a 21 gauge needle was advanced into the left internal jugular vein under direct, real-time ultrasound guidance. Ultrasound image documentation was performed. Jugular venous access was dilated over a guidewire. A 24 French non tunneled 13 [place] hemodialysis catheter was then advanced over the wire. Final catheter positioning was confirmed and documented with a fluoroscopic spot image. The catheter was aspirated, flushed with saline, and injected with appropriate volume heparin dwells. The catheter exit site was secured with 0-Prolene retention sutures. A pre-existing jejunostomy catheter  was injected with contrast under fluoroscopy. The catheter was removed over a guidewire. A new 16 French red rubber jejunostomy catheter was then placed over the guidewire. Catheter positioning was confirmed by fluoroscopy after contrast injection and a fluoroscopic spot image saved. The catheter was secured at its exit site with a Prolene retention suture. COMPLICATIONS: None.  No pneumothorax. FINDINGS: There is a pre-existing central line in the right internal jugular vein. After left jugular temporary hemodialysis catheter placement, the tip lies at the cavoatrial junction. The catheter aspirates normally and is ready for immediate use. The percutaneous jejunostomy was replaced with a new 16 French catheter. The catheter tip lies in the jejunum. IMPRESSION: 1. Placement of non-tunneled central venous hemodialysis catheter via the left internal jugular vein. The catheter tip lies at the cavoatrial junction. The catheter is  ready for immediate use. 2. Replacement of damaged percutaneous jejunostomy feeding catheter. A new 16 French red rubber catheter was advanced over a guidewire. The tip of this catheter lies in the lumen of the jejunum. Electronically Signed   By: Irish Lack M.D.   On: 12/15/2016 14:52   Ir Replc Duoden/jejuno Tube Percut W/fluoro  Result Date: 12/15/2016 CLINICAL DATA:  Renal failure and hyperkalemia. Need for non tunneled hemodialysis catheter. Damaged indwelling percutaneous jejunostomy catheter requires replacement. EXAM: 1. NON-TUNNELED CENTRAL VENOUS CATHETER PLACEMENT WITH ULTRASOUND AND FLUOROSCOPIC GUIDANCE 2. REPLACEMENT OF PERCUTANEOUS JEJUNOSTOMY CATHETER FLUOROSCOPY TIME:  1 minutes and 24 seconds.  10.0 mGy. PROCEDURE: The procedure, risks, benefits, and alternatives were explained to the patient's wife. Questions regarding the procedure were encouraged and answered. The patient's wife understands and consents to the procedure. A time-out was performed prior to initiating the procedure. The left neck and chest were prepped with chlorhexidine in a sterile fashion, and a sterile drape was applied covering the operative field. Maximum barrier sterile technique with sterile gowns and gloves were used for the procedure. Local anesthesia was provided with 1% lidocaine. After creating a small venotomy incision, a 21 gauge needle was advanced into the left internal jugular vein under direct, real-time ultrasound guidance. Ultrasound image documentation was performed. Jugular venous access was dilated over a guidewire. A 24 French non tunneled 13 [place] hemodialysis catheter was then advanced over the wire. Final catheter positioning was confirmed and documented with a fluoroscopic spot image. The catheter was aspirated, flushed with saline, and injected with appropriate volume heparin dwells. The catheter exit site was secured with 0-Prolene retention sutures. A pre-existing jejunostomy catheter was  injected with contrast under fluoroscopy. The catheter was removed over a guidewire. A new 16 French red rubber jejunostomy catheter was then placed over the guidewire. Catheter positioning was confirmed by fluoroscopy after contrast injection and a fluoroscopic spot image saved. The catheter was secured at its exit site with a Prolene retention suture. COMPLICATIONS: None.  No pneumothorax. FINDINGS: There is a pre-existing central line in the right internal jugular vein. After left jugular temporary hemodialysis catheter placement, the tip lies at the cavoatrial junction. The catheter aspirates normally and is ready for immediate use. The percutaneous jejunostomy was replaced with a new 16 French catheter. The catheter tip lies in the jejunum. IMPRESSION: 1. Placement of non-tunneled central venous hemodialysis catheter via the left internal jugular vein. The catheter tip lies at the cavoatrial junction. The catheter is ready for immediate use. 2. Replacement of damaged percutaneous jejunostomy feeding catheter. A new 16 French red rubber catheter was advanced over a guidewire. The tip of this catheter lies in  the lumen of the jejunum. Electronically Signed   By: Irish Lack M.D.   On: 12/15/2016 14:52   Ir US Guide Vasc Access Left  Result Date: 12/15/2016 CLINICAL DATA:  Renal failure and hyperkalemia. Need for non tunneled hemodialysis catheter. Damaged indwelling percutaneous jejunostomy catheter requires replacement. EXAM: 1. NON-TUNNELED CENTRAL VENOUS CATHETER PLACEMENT WITH ULTRASOUND AND FLUOROSCOPIC GUIDANCE 2. REPLACEMENT OF PERCUTANEOUS JEJUNOSTOMY CATHETER FLUOROSCOPY TIME:  1 minutes and 24 seconds.  10.0 mGy. PROCEDURE: The procedure, risks, benefits, and alternatives were explained to the patient's wife. Questions regarding the procedure were encouraged and answered. The patient's wife understands and consents to the procedure. A time-out was performed prior to initiating the procedure. The  left neck and chest were prepped with chlorhexidine in a sterile fashion, and a sterile drape was applied covering the operative field. Maximum barrier sterile technique with sterile gowns and gloves were used for the procedure. Local anesthesia was provided with 1% lidocaine. After creating a small venotomy incision, a 21 gauge needle was advanced into the left internal jugular vein under direct, real-time ultrasound guidance. Ultrasound image documentation was performed. Jugular venous access was dilated over a guidewire. A 24 French non tunneled 13 [place] hemodialysis catheter was then advanced over the wire. Final catheter positioning was confirmed and documented with a fluoroscopic spot image. The catheter was aspirated, flushed with saline, and injected with appropriate volume heparin dwells. The catheter exit site was secured with 0-Prolene retention sutures. A pre-existing jejunostomy catheter was injected with contrast under fluoroscopy. The catheter was removed over a guidewire. A new 16 French red rubber jejunostomy catheter was then placed over the guidewire. Catheter positioning was confirmed by fluoroscopy after contrast injection and a fluoroscopic spot image saved. The catheter was secured at its exit site with a Prolene retention suture. COMPLICATIONS: None.  No pneumothorax. FINDINGS: There is a pre-existing central line in the right internal jugular vein. After left jugular temporary hemodialysis catheter placement, the tip lies at the cavoatrial junction. The catheter aspirates normally and is ready for immediate use. The percutaneous jejunostomy was replaced with a new 16 French catheter. The catheter tip lies in the jejunum. IMPRESSION: 1. Placement of non-tunneled central venous hemodialysis catheter via the left internal jugular vein. The catheter tip lies at the cavoatrial junction. The catheter is ready for immediate use. 2. Replacement of damaged percutaneous jejunostomy feeding catheter.  A new 16 French red rubber catheter was advanced over a guidewire. The tip of this catheter lies in the lumen of the jejunum. Electronically Signed   By: Irish Lack M.D.   On: 12/15/2016 14:52   Dg Chest Port 1 View  Result Date: 12/14/2016 CLINICAL DATA:  Right internal jugular venous catheter placement. EXAM: PORTABLE CHEST 1 VIEW COMPARISON:  Chest x-ray of Dec 10, 2014 FINDINGS: A right internal jugular catheter is in place. The tip appears to lie in the proximal SVC. No postprocedure pneumothorax or hemothorax is observed. The lungs are reasonably well inflated. The interstitial markings remain coarse especially inferiorly. The heart is normal in size. There is calcification in the wall of the aortic arch. IMPRESSION: There is no immediate postprocedure complication following right internal jugular venous catheter placement. Electronically Signed   By: David  [place] M.D.   On: 12/14/2016 16:52     Medications:     heparin, lidocaine  Assessment/ Plan:  83 y.o.[place] male with history of coronary disease, left circumflex stent in 2007, COPD, diabetes type 2 insulin-dependent, hypertension, hyperlipidemia, BPH, peptic ulcer  disease, right hip surgery, cholecystectomy, hernia repair, was admitted on 12/09/2016 for ongoing postsurgical care. Patient underwent ex - laparotomy for perforated duodenal ulcer 4/21 and repair of leak on 4/23. Hospital course complicated by acute respiratory failure requiring intubation, now extubated,  1. ARF, oliguric ATN likely from sepsis probably exacerbated by intravascular volume depletion Patient continues to be severely acidotic with bicarbonate of 10. Continues to have severe azotemia with BUN of 91, creatinine 5.5. Phosphorus is high at 8.7 We will schedule him for another dialysis today tomorrow and then continue on TTS schedule for as long as necessary  2.  Acidosis Expected to correct with hemodialysis  3. Hyperkalemia -  Improved with  HD  4. Status post exploratory laparotomy for perforated duodenal ulcer   LOS: 0 [facility] 5/18/20189:48 AM  [facility] [place], [place] [phone]

## 2016-12-16 NOTE — Progress Notes (Signed)
Patient unavailable for aspiration/drainage this afternoon due to need of dialysis.  Discussed plan with Dr. Deanne CofferHassell.  Will proceed with aspiration tomorrow morning (Saturday).  PA to unit for orders. RN aware.   Loyce DysKacie Taela Charbonneau, MMS RDN PA-C 5:27 PM

## 2016-12-16 NOTE — Progress Notes (Signed)
Referring Physician(s):  Dr. Sharyon Medicus  Supervising Physician: Oley Balm  Patient Status:  [facility] - In-pt  Chief Complaint:  Psoas fluid collection aspiration  Subjective: Patient s/p J-tube exchange and temp HD catheter placement yesterday.  Tube feeds resumed through J-tube without issue.  HD overnight improved K to 4.6 Patient was also followed by IR for possible psoas fluid collection.  Discussed with Waylan Boga, NP who would still like to pursue aspiration for culture.   Allergies: Patient has no allergy information on record.  Medications: Prior to Admission medications   Not on File     Vital Signs: BP (!) 123/57   Resp (!) 29   SpO2 97%   Physical Exam  Sleepy, but arousable and awake.  Does not follow commands.  Respiratory:  No increased work of breathing, breath sounds coarse bilaterally. Heart: Regular rate and rhythm.  Abdominal drains remain in place.   Imaging: Ct Abdomen Pelvis Wo Contrast  Result Date: 12/13/2016 CLINICAL DATA:  Evaluation for abscess EXAM: CT ABDOMEN AND PELVIS WITHOUT CONTRAST TECHNIQUE: Multidetector CT imaging of the abdomen and pelvis was performed following the standard protocol without IV contrast. COMPARISON:  None. FINDINGS: Lower chest: No pulmonary nodules. No visible pleural or pericardial effusion. Hepatobiliary: There is a percutaneous biliary stent entering from a right upper quadrant approach with its tip in the second part of the duodenum. The gallbladder is absent. Pancreas: Normal pancreatic contours. No peripancreatic fluid collection or pancreatic ductal dilatation. Spleen: Normal. Adrenals/Urinary Tract: Normal adrenal glands. No hydronephrosis or nephrolithiasis. No abnormal perinephric stranding. No ureteral obstruction. Stomach/Bowel: There is no hiatal hernia. The stomach and duodenum are normal. There is no dilated small bowel or enteric inflammation. There is diverticulosis of the sigmoid colon. The appendix is normal.  Vascular/Lymphatic: There is aortic atherosclerosis. No abdominal or pelvic adenopathy. Reproductive: Foley catheter in place. Musculoskeletal: Right total hip arthroplasty. Severe lower lumbar facet arthrosis and multilevel thoracolumbar osteophytosis. There is a midline anterior surgical wound. Other: There are bilateral fluid collections within the retroperitoneum, lateral to the psoas muscles. On the right, the collection measures 8.4 x 2.2 by 7.8 cm. On the left the collection measures 8.8 x 3.3 x 4.8 cm. The density is that of simple fluid. There is no apparent wall. IMPRESSION: 1. Small bilateral retroperitoneal fluid collections adjacent to the psoas muscles. No abscess formation. 2. Percutaneous biliary stent/drain extending from the right upper quadrant MTP effect portion duodenum. 3. Aortic atherosclerosis and sigmoid diverticulosis. Electronically Signed   By: Deatra Robinson M.D.   On: 12/13/2016 06:16   Ct Head Wo Contrast  Result Date: 12/15/2016 CLINICAL DATA:  Flaccid left and right arms EXAM: CT HEAD WITHOUT CONTRAST TECHNIQUE: Contiguous axial images were obtained from the base of the skull through the vertex without intravenous contrast. COMPARISON:  None. FINDINGS: Brain: There is hypoattenuation within both occipital lobes. No intracranial hemorrhage. No midline shift or other mass effect. No extra-axial collection. There is periventricular hypoattenuation compatible with chronic microvascular disease. Vascular: Atherosclerotic calcification of the internal carotid arteries at the skull base. Skull: Normal visualized skull base, calvarium and extracranial soft tissues. Sinuses/Orbits: No sinus fluid levels or advanced mucosal thickening. No mastoid effusion. Normal orbits. IMPRESSION: Focal areas of hypoattenuation within both occipital lobes. The findings are suggestive of posterior reversible encephalopathy syndrome (PRES). MRI is recommended for better characterization. Electronically  Signed   By: Deatra Robinson M.D.   On: 12/15/2016 01:48   Ir Fluoro Guide Cv Line Left  Result Date: 12/15/2016 CLINICAL DATA:  Renal failure and hyperkalemia. Need for non tunneled hemodialysis catheter. Damaged indwelling percutaneous jejunostomy catheter requires replacement. EXAM: 1. NON-TUNNELED CENTRAL VENOUS CATHETER PLACEMENT WITH ULTRASOUND AND FLUOROSCOPIC GUIDANCE 2. REPLACEMENT OF PERCUTANEOUS JEJUNOSTOMY CATHETER FLUOROSCOPY TIME:  1 minutes and 24 seconds.  10.0 mGy. PROCEDURE: The procedure, risks, benefits, and alternatives were explained to the patient's wife. Questions regarding the procedure were encouraged and answered. The patient's wife understands and consents to the procedure. A time-out was performed prior to initiating the procedure. The left neck and chest were prepped with chlorhexidine in a sterile fashion, and a sterile drape was applied covering the operative field. Maximum barrier sterile technique with sterile gowns and gloves were used for the procedure. Local anesthesia was provided with 1% lidocaine. After creating a small venotomy incision, a 21 gauge needle was advanced into the left internal jugular vein under direct, real-time ultrasound guidance. Ultrasound image documentation was performed. Jugular venous access was dilated over a guidewire. A 24 French non tunneled 13 [place] hemodialysis catheter was then advanced over the wire. Final catheter positioning was confirmed and documented with a fluoroscopic spot image. The catheter was aspirated, flushed with saline, and injected with appropriate volume heparin dwells. The catheter exit site was secured with 0-Prolene retention sutures. A pre-existing jejunostomy catheter was injected with contrast under fluoroscopy. The catheter was removed over a guidewire. A new 16 French red rubber jejunostomy catheter was then placed over the guidewire. Catheter positioning was confirmed by fluoroscopy after contrast injection and a  fluoroscopic spot image saved. The catheter was secured at its exit site with a Prolene retention suture. COMPLICATIONS: None.  No pneumothorax. FINDINGS: There is a pre-existing central line in the right internal jugular vein. After left jugular temporary hemodialysis catheter placement, the tip lies at the cavoatrial junction. The catheter aspirates normally and is ready for immediate use. The percutaneous jejunostomy was replaced with a new 16 French catheter. The catheter tip lies in the jejunum. IMPRESSION: 1. Placement of non-tunneled central venous hemodialysis catheter via the left internal jugular vein. The catheter tip lies at the cavoatrial junction. The catheter is ready for immediate use. 2. Replacement of damaged percutaneous jejunostomy feeding catheter. A new 16 French red rubber catheter was advanced over a guidewire. The tip of this catheter lies in the lumen of the jejunum. Electronically Signed   By: Irish LackGlenn  Yamagata M.D.   On: 12/15/2016 14:52   Ir Replc Duoden/jejuno Tube Percut W/fluoro  Result Date: 12/15/2016 CLINICAL DATA:  Renal failure and hyperkalemia. Need for non tunneled hemodialysis catheter. Damaged indwelling percutaneous jejunostomy catheter requires replacement. EXAM: 1. NON-TUNNELED CENTRAL VENOUS CATHETER PLACEMENT WITH ULTRASOUND AND FLUOROSCOPIC GUIDANCE 2. REPLACEMENT OF PERCUTANEOUS JEJUNOSTOMY CATHETER FLUOROSCOPY TIME:  1 minutes and 24 seconds.  10.0 mGy. PROCEDURE: The procedure, risks, benefits, and alternatives were explained to the patient's wife. Questions regarding the procedure were encouraged and answered. The patient's wife understands and consents to the procedure. A time-out was performed prior to initiating the procedure. The left neck and chest were prepped with chlorhexidine in a sterile fashion, and a sterile drape was applied covering the operative field. Maximum barrier sterile technique with sterile gowns and gloves were used for the procedure. Local  anesthesia was provided with 1% lidocaine. After creating a small venotomy incision, a 21 gauge needle was advanced into the left internal jugular vein under direct, real-time ultrasound guidance. Ultrasound image documentation was performed. Jugular venous access was dilated over a guidewire. A  24 [place] non tunneled 13 [place] hemodialysis catheter was then advanced over the wire. Final catheter positioning was confirmed and documented with a fluoroscopic spot image. The catheter was aspirated, flushed with saline, and injected with appropriate volume heparin dwells. The catheter exit site was secured with 0-Prolene retention sutures. A pre-existing jejunostomy catheter was injected with contrast under fluoroscopy. The catheter was removed over a guidewire. A new 16 French red rubber jejunostomy catheter was then placed over the guidewire. Catheter positioning was confirmed by fluoroscopy after contrast injection and a fluoroscopic spot image saved. The catheter was secured at its exit site with a Prolene retention suture. COMPLICATIONS: None.  No pneumothorax. FINDINGS: There is a pre-existing central line in the right internal jugular vein. After left jugular temporary hemodialysis catheter placement, the tip lies at the cavoatrial junction. The catheter aspirates normally and is ready for immediate use. The percutaneous jejunostomy was replaced with a new 16 French catheter. The catheter tip lies in the jejunum. IMPRESSION: 1. Placement of non-tunneled central venous hemodialysis catheter via the left internal jugular vein. The catheter tip lies at the cavoatrial junction. The catheter is ready for immediate use. 2. Replacement of damaged percutaneous jejunostomy feeding catheter. A new 16 French red rubber catheter was advanced over a guidewire. The tip of this catheter lies in the lumen of the jejunum. Electronically Signed   By: Irish Lack M.D.   On: 12/15/2016 14:52   Ir US Guide Vasc Access  Left  Result Date: 12/15/2016 CLINICAL DATA:  Renal failure and hyperkalemia. Need for non tunneled hemodialysis catheter. Damaged indwelling percutaneous jejunostomy catheter requires replacement. EXAM: 1. NON-TUNNELED CENTRAL VENOUS CATHETER PLACEMENT WITH ULTRASOUND AND FLUOROSCOPIC GUIDANCE 2. REPLACEMENT OF PERCUTANEOUS JEJUNOSTOMY CATHETER FLUOROSCOPY TIME:  1 minutes and 24 seconds.  10.0 mGy. PROCEDURE: The procedure, risks, benefits, and alternatives were explained to the patient's wife. Questions regarding the procedure were encouraged and answered. The patient's wife understands and consents to the procedure. A time-out was performed prior to initiating the procedure. The left neck and chest were prepped with chlorhexidine in a sterile fashion, and a sterile drape was applied covering the operative field. Maximum barrier sterile technique with sterile gowns and gloves were used for the procedure. Local anesthesia was provided with 1% lidocaine. After creating a small venotomy incision, a 21 gauge needle was advanced into the left internal jugular vein under direct, real-time ultrasound guidance. Ultrasound image documentation was performed. Jugular venous access was dilated over a guidewire. A 24 French non tunneled 13 [place] hemodialysis catheter was then advanced over the wire. Final catheter positioning was confirmed and documented with a fluoroscopic spot image. The catheter was aspirated, flushed with saline, and injected with appropriate volume heparin dwells. The catheter exit site was secured with 0-Prolene retention sutures. A pre-existing jejunostomy catheter was injected with contrast under fluoroscopy. The catheter was removed over a guidewire. A new 16 French red rubber jejunostomy catheter was then placed over the guidewire. Catheter positioning was confirmed by fluoroscopy after contrast injection and a fluoroscopic spot image saved. The catheter was secured at its exit site with a  Prolene retention suture. COMPLICATIONS: None.  No pneumothorax. FINDINGS: There is a pre-existing central line in the right internal jugular vein. After left jugular temporary hemodialysis catheter placement, the tip lies at the cavoatrial junction. The catheter aspirates normally and is ready for immediate use. The percutaneous jejunostomy was replaced with a new 16 French catheter. The catheter tip lies in the jejunum. IMPRESSION: 1. Placement  of non-tunneled central venous hemodialysis catheter via the left internal jugular vein. The catheter tip lies at the cavoatrial junction. The catheter is ready for immediate use. 2. Replacement of damaged percutaneous jejunostomy feeding catheter. A new 16 French red rubber catheter was advanced over a guidewire. The tip of this catheter lies in the lumen of the jejunum. Electronically Signed   By: Irish Lack M.D.   On: 12/15/2016 14:52   Dg Chest Port 1 View  Result Date: 12/14/2016 CLINICAL DATA:  Right internal jugular venous catheter placement. EXAM: PORTABLE CHEST 1 VIEW COMPARISON:  Chest x-ray of Dec 10, 2014 FINDINGS: A right internal jugular catheter is in place. The tip appears to lie in the proximal SVC. No postprocedure pneumothorax or hemothorax is observed. The lungs are reasonably well inflated. The interstitial markings remain coarse especially inferiorly. The heart is normal in size. There is calcification in the wall of the aortic arch. IMPRESSION: There is no immediate postprocedure complication following right internal jugular venous catheter placement. Electronically Signed   By: David  [place] M.D.   On: 12/14/2016 16:52    Labs:  CBC:  Recent Labs  12/13/16 1007 12/14/16 0846 12/15/16 0509 12/16/16 0743  WBC 16.8* 12.2* 13.2* 7.9  HGB 10.2* 9.8* 9.5* 9.2*  HCT 33.6* 32.0* 30.9* 28.3*  PLT 245 222 214 195    COAGS:  Recent Labs  12/13/16 1007  INR 1.45  APTT 28    BMP:  Recent Labs  12/14/16 0804 12/14/16 1039  12/14/16 1426 12/15/16 0509 12/16/16 0743  NA 143 145  --  145  145 144  K 6.8* 5.5* 5.7* 5.6*  5.7* 4.2  CL 124* 124*  --  119*  119* 116*  CO2 8* 10*  --  10*  10* 10*  GLUCOSE 207* 134*  --  98  101* 176*  BUN 95* 92*  --  98*  97* 91*  CALCIUM 8.4* 8.7*  --  8.6*  8.7* 8.0*  CREATININE 3.68* 3.66*  --  4.70*  4.67* 5.05*  GFRNONAA 14* 14*  --  10*  11* 10*  GFRAA 16* 16*  --  12*  12* 11*    LIVER FUNCTION TESTS:  Recent Labs  12/14/16 0804 12/14/16 1039 12/15/16 0509 12/16/16 0743  BILITOT  --   --  0.7  --   AST  --   --  22  --   ALT  --   --  16*  --   ALKPHOS  --   --  83  --   PROT  --   --  6.4*  --   ALBUMIN 2.0* 1.9* 1.9*  1.9* 1.8*    Assessment and Plan: Psoas fluid collection IR consulted for possible aspiration and drainage of psoas fluid collection. Discussed with Dr. Deanne Coffer.  Patient tolerated procedures in IR yesterday.  His potassium has improved.  He may be able to lie prone, but with additional anterior drains may have to rotate on side/back for procedure.  Discussed with CT.  Consent has been signed and placed in chart.  Wife not at bedside today.    Electronically Signed: Hoyt Koch, PA 12/16/2016, 2:18 PM   I spent a total of 15 Minutes at the the patient's bedside AND on the patient's hospital floor or unit, greater than 50% of which was counseling/coordinating care for Psoas fluid collections

## 2016-12-17 ENCOUNTER — Encounter: Payer: Self-pay | Admitting: Radiology

## 2016-12-17 ENCOUNTER — Other Ambulatory Visit (HOSPITAL_COMMUNITY): Payer: Self-pay

## 2016-12-17 LAB — CBC
HCT: 29.1 % — ABNORMAL LOW (ref 39.0–52.0)
Hemoglobin: 9.3 g/dL — ABNORMAL LOW (ref 13.0–17.0)
MCH: 27.7 pg (ref 26.0–34.0)
MCHC: 32 g/dL (ref 30.0–36.0)
MCV: 86.6 fL (ref 78.0–100.0)
Platelets: 183 10*3/uL (ref 150–400)
RBC: 3.36 MIL/uL — AB (ref 4.22–5.81)
RDW: 15.1 % (ref 11.5–15.5)
WBC: 10.8 10*3/uL — ABNORMAL HIGH (ref 4.0–10.5)

## 2016-12-17 LAB — CULTURE, BLOOD (ROUTINE X 2)
CULTURE: NO GROWTH
CULTURE: NO GROWTH
SPECIAL REQUESTS: ADEQUATE
Special Requests: ADEQUATE

## 2016-12-17 LAB — RENAL FUNCTION PANEL
ALBUMIN: 1.6 g/dL — AB (ref 3.5–5.0)
ANION GAP: 18 — AB (ref 5–15)
BUN: 79 mg/dL — ABNORMAL HIGH (ref 6–20)
CALCIUM: 7.9 mg/dL — AB (ref 8.9–10.3)
CO2: 15 mmol/L — ABNORMAL LOW (ref 22–32)
Chloride: 112 mmol/L — ABNORMAL HIGH (ref 101–111)
Creatinine, Ser: 4.96 mg/dL — ABNORMAL HIGH (ref 0.61–1.24)
GFR calc non Af Amer: 10 mL/min — ABNORMAL LOW (ref >60)
GFR, EST AFRICAN AMERICAN: 11 mL/min — AB (ref >60)
GLUCOSE: 195 mg/dL — AB (ref 65–99)
PHOSPHORUS: 7.6 mg/dL — AB (ref 2.5–4.6)
Potassium: 3.7 mmol/L (ref 3.5–5.1)
SODIUM: 145 mmol/L (ref 135–145)

## 2016-12-17 LAB — CULTURE, RESPIRATORY

## 2016-12-17 LAB — MAGNESIUM: Magnesium: 2 mg/dL (ref 1.7–2.4)

## 2016-12-17 LAB — CULTURE, RESPIRATORY W GRAM STAIN

## 2016-12-17 MED ORDER — LIDOCAINE HCL 1 % IJ SOLN
INTRAMUSCULAR | Status: AC
  Filled 2016-12-17: qty 20

## 2016-12-17 NOTE — Procedures (Signed)
1. CT guided right retroperitoneal abscess drain catheter placement 2. CT guided left retroperitoneal abscess drain catheter placement  25ml puruelnt aspirate from each side, sent separately for GS, C&S No complication No blood loss. See complete dictation in [facility].

## 2016-12-18 LAB — VANCOMYCIN, TROUGH: VANCOMYCIN TR: 20 ug/mL (ref 15–20)

## 2016-12-19 LAB — CBC
HCT: 28.3 % — ABNORMAL LOW (ref 39.0–52.0)
Hemoglobin: 9.3 g/dL — ABNORMAL LOW (ref 13.0–17.0)
MCH: 28.4 pg (ref 26.0–34.0)
MCHC: 32.9 g/dL (ref 30.0–36.0)
MCV: 86.5 fL (ref 78.0–100.0)
PLATELETS: ADEQUATE 10*3/uL (ref 150–400)
RBC: 3.27 MIL/uL — ABNORMAL LOW (ref 4.22–5.81)
RDW: 15.8 % — AB (ref 11.5–15.5)
WBC: 16.3 10*3/uL — ABNORMAL HIGH (ref 4.0–10.5)

## 2016-12-19 LAB — RENAL FUNCTION PANEL
ALBUMIN: 1.5 g/dL — AB (ref 3.5–5.0)
Anion gap: 15 (ref 5–15)
BUN: 67 mg/dL — AB (ref 6–20)
CO2: 19 mmol/L — ABNORMAL LOW (ref 22–32)
Calcium: 7.5 mg/dL — ABNORMAL LOW (ref 8.9–10.3)
Chloride: 103 mmol/L (ref 101–111)
Creatinine, Ser: 5.21 mg/dL — ABNORMAL HIGH (ref 0.61–1.24)
GFR calc Af Amer: 11 mL/min — ABNORMAL LOW (ref >60)
GFR, EST NON AFRICAN AMERICAN: 9 mL/min — AB (ref >60)
Glucose, Bld: 145 mg/dL — ABNORMAL HIGH (ref 65–99)
PHOSPHORUS: 7.4 mg/dL — AB (ref 2.5–4.6)
Potassium: 2.6 mmol/L — CL (ref 3.5–5.1)
SODIUM: 137 mmol/L (ref 135–145)

## 2016-12-19 NOTE — Progress Notes (Signed)
[facility] [place]  Subjective:   Patient underwent HD on Saturday/ Tolerated well Remains critically ill. Levophed at 5 Sleepy at present. Did not answer any questions. His wife reports that he was able to speak a few works this morning S Cr remains critically high  Objective:  Vital signs in last 24 hours:     Temperature 98, pulse 99, respirations 18, blood pressure 125/77  Intake/Output:   No intake or output data in the 24 hours ending 12/19/16 1447   Physical Exam: General: NAD, laying in bed  HEENT Eyes closed. Did not open mouth  Neck Left IJ temp dialysis cathter  Pulm/lungs Coarse b/l,   CVS/Heart Regular, tachycardic  Abdomen:  Multiple drains in place, Dressing in Place., Foley cathter in place  Extremities: + heels in soft support, trace edema  Neurologic: Not following commands  Skin: Scattered bruises  Access: left IJ temporary doses catheter       Basic Metabolic Panel:   Recent Labs Lab 12/14/16 0804 12/14/16 1039 12/14/16 1426 12/15/16 0509 12/16/16 0743 12/17/16 0524  NA 143 145  --  145  145 144 145  K 6.8* 5.5* 5.7* 5.6*  5.7* 4.2 3.7  CL 124* 124*  --  119*  119* 116* 112*  CO2 8* 10*  --  10*  10* 10* 15*  GLUCOSE 207* 134*  --  98  101* 176* 195*  BUN 95* 92*  --  98*  97* 91* 79*  CREATININE 3.68* 3.66*  --  4.70*  4.67* 5.05* 4.96*  CALCIUM 8.4* 8.7*  --  8.6*  8.7* 8.0* 7.9*  MG 2.7* 2.5*  --  2.2 2.0 2.0  PHOS 8.7* 8.7*  --  8.8*  8.8* 8.7* 7.6*     CBC:  Recent Labs Lab 12/13/16 1007 12/14/16 0846 12/15/16 0509 12/16/16 0743 12/17/16 0524  WBC 16.8* 12.2* 13.2* 7.9 10.8*  NEUTROABS  --  8.8* 10.6* 6.3  --   HGB 10.2* 9.8* 9.5* 9.2* 9.3*  HCT 33.6* 32.0* 30.9* 28.3* 29.1*  MCV 92.1 92.5 90.1 87.3 86.6  PLT 245 222 214 195 183      Lab Results  Component Value Date   HEPBSAG Negative 12/15/2016      Microbiology:  Recent Results (from the past 240 hour(s))   Culture, Urine     Status: None   Collection Time: 12/12/16  3:00 PM  Result Value Ref Range Status   Specimen Description URINE, RANDOM  Final   Special Requests NONE  Final   Culture NO GROWTH  Final   Report Status 12/13/2016 FINAL  Final  Culture, blood (routine x 2)     Status: None   Collection Time: 12/12/16  3:23 PM  Result Value Ref Range Status   Specimen Description BLOOD LEFT ANTECUBITAL  Final   Special Requests IN PEDIATRIC BOTTLE Blood Culture adequate volume  Final   Culture NO GROWTH 5 DAYS  Final   Report Status 12/17/2016 FINAL  Final  Culture, blood (routine x 2)     Status: None   Collection Time: 12/12/16  3:25 PM  Result Value Ref Range Status   Specimen Description BLOOD LEFT HAND  Final   Special Requests IN PEDIATRIC BOTTLE Blood Culture adequate volume  Final   Culture NO GROWTH 5 DAYS  Final   Report Status 12/17/2016 FINAL  Final  Culture, expectorated sputum-assessment     Status: None   Collection Time: 12/14/16  2:05 PM  Result Value Ref Range Status   Specimen Description SPUTUM  Final   Special Requests NONE  Final   Sputum evaluation THIS SPECIMEN IS ACCEPTABLE FOR SPUTUM CULTURE  Final   Report Status 12/14/2016 FINAL  Final  Culture, respiratory (NON-Expectorated)     Status: None   Collection Time: 12/14/16  2:05 PM  Result Value Ref Range Status   Specimen Description SPUTUM  Final   Special Requests NONE Reflexed from [id number]  Final   Gram Stain   Final    ABUNDANT WBC PRESENT,BOTH PMN AND MONONUCLEAR ABUNDANT GRAM POSITIVE COCCI IN PAIRS FEW GRAM NEGATIVE RODS    Culture   Final    ABUNDANT METHICILLIN RESISTANT STAPHYLOCOCCUS AUREUS   Report Status 12/17/2016 FINAL  Final   Organism ID, Bacteria METHICILLIN RESISTANT STAPHYLOCOCCUS AUREUS  Final      Susceptibility   Methicillin resistant staphylococcus aureus - MIC*    CIPROFLOXACIN <=0.5 SENSITIVE Sensitive     ERYTHROMYCIN <=0.25 SENSITIVE Sensitive     GENTAMICIN <=0.5  SENSITIVE Sensitive     OXACILLIN >=4 RESISTANT Resistant     TETRACYCLINE <=1 SENSITIVE Sensitive     VANCOMYCIN 1 SENSITIVE Sensitive     TRIMETH/SULFA <=10 SENSITIVE Sensitive     CLINDAMYCIN <=0.25 SENSITIVE Sensitive     RIFAMPIN <=0.5 SENSITIVE Sensitive     Inducible Clindamycin NEGATIVE Sensitive     * ABUNDANT METHICILLIN RESISTANT STAPHYLOCOCCUS AUREUS  Aerobic/Anaerobic Culture (surgical/deep wound)     Status: None (Preliminary result)   Collection Time: 12/17/16 12:38 PM  Result Value Ref Range Status   Specimen Description ABSCESS  Final   Special Requests LEFT RETROPERITONEAL  Final   Gram Stain NO WBC SEEN NO ORGANISMS SEEN   Final   Culture   Final    NO GROWTH 2 DAYS NO ANAEROBES ISOLATED; CULTURE IN PROGRESS FOR 5 DAYS   Report Status PENDING  Incomplete  Aerobic/Anaerobic Culture (surgical/deep wound)     Status: None (Preliminary result)   Collection Time: 12/17/16 12:39 PM  Result Value Ref Range Status   Specimen Description ABSCESS  Final   Special Requests RIGHT RETROPERITONEAL  Final   Gram Stain   Final    DEGENERATED CELLULAR MATERIAL PRESENT FEW GRAM POSITIVE COCCI IN CHAINS IN CLUSTERS    Culture FEW STAPHYLOCOCCUS AUREUS  Final   Report Status PENDING  Incomplete   Organism ID, Bacteria STAPHYLOCOCCUS AUREUS  Final      Susceptibility   Staphylococcus aureus - MIC*    CIPROFLOXACIN >=8 RESISTANT Resistant     ERYTHROMYCIN >=8 RESISTANT Resistant     GENTAMICIN <=0.5 SENSITIVE Sensitive     OXACILLIN 0.5 SENSITIVE Sensitive     TETRACYCLINE <=1 SENSITIVE Sensitive     VANCOMYCIN 1 SENSITIVE Sensitive     TRIMETH/SULFA <=10 SENSITIVE Sensitive     CLINDAMYCIN <=0.25 SENSITIVE Sensitive     RIFAMPIN <=0.5 SENSITIVE Sensitive     Inducible Clindamycin NEGATIVE Sensitive     * FEW STAPHYLOCOCCUS AUREUS    Coagulation Studies: No results for input(s): LABPROT, INR in the last 72 hours.  Urinalysis: No results for input(s): COLORURINE,  LABSPEC, PHURINE, GLUCOSEU, HGBUR, BILIRUBINUR, KETONESUR, PROTEINUR, UROBILINOGEN, NITRITE, LEUKOCYTESUR in the last 72 hours.  Invalid input(s): APPERANCEUR    Imaging: No results found.   Medications:       Assessment/ Plan:  83 y.o.[place] male with history of coronary disease, left circumflex stent in 2007, COPD, diabetes type 2 insulin-dependent, hypertension, hyperlipidemia, BPH, peptic ulcer  disease, right hip surgery, cholecystectomy, hernia repair, was admitted on 12/09/2016 for ongoing postsurgical care. Patient underwent ex - laparotomy for perforated duodenal ulcer 4/21 and repair of leak on 4/23. Hospital course complicated by acute respiratory failure requiring intubation, now extubated,  1. ARF, oliguric ATN likely from sepsis probably exacerbated by intravascular volume depletion Patient continues to be severely acidotic with bicarbonate of 15. Dialysis started on 12/16/16 for severe azotemia and acdosis Phosphorus is high at 7.6 We will schedule him for another dialysis today then MWF schedule  2.  Acidosis Expected to correct with hemodialysis  3. Hyperkalemia -  Improved with HD  4. Hypotension - requiring levophed  5. Altered mental status Starting to improve according to family  6. Hyperphosphatemia - use low phos tube feeds  7. Status post exploratory laparotomy for perforated duodenal ulcer   LOS: 0 [facility] 5/21/20182:47 PM  [facility] [place], [place] [phone]

## 2016-12-20 NOTE — Progress Notes (Signed)
Referring Physician(s): A Hijazi  Supervising Physician: Gilmer MorWagner, Jaime  [facility]  [facility]  Chief Complaint:  IMPRESSION: 1. Bilateral retroperitoneal abscess drain catheter placement under CT guidance  Subjective:  Drains in place output is brownish pus Left drain is broken at site of connection to tubing   Allergies: Patient has no allergy information on record.  Medications: Prior to Admission medications   Not on File     Vital Signs: BP (!) 82/53   Pulse (!) 115   Resp (!) 33   SpO2 99%   Physical Exam  Skin: Skin is warm and dry.  Sites of drain are clean and dry NT no bleeding OP is purulent Left drain has fractured---needs exchange --plan for am  Nursing note and vitals reviewed.   Imaging: Ct Image Guided Fluid Drain By Catheter  Result Date: 12/17/2016 CLINICAL DATA:  History of duodenal perforation. Bilateral posterior retroperitoneal fluid collections. EXAM: 1.  CT GUIDED DRAINAGE OF LEFT RETROPERITONEAL ABSCESS 2.  CT GUIDED DRAINAGE OF RIGHT RETROPERITONEAL ABSCESS ANESTHESIA/SEDATION: No additional IV sedation medication was required. PROCEDURE: The procedure, risks, benefits, and alternatives were explained to the patient. Questions regarding the procedure were encouraged and answered. The patient understands and consents to the procedure. Patient placed in right anterior oblique position. Select axial scans through the retroperitoneum performed. The bilateral collections were localized and appropriate skin entry sites determined and marked. The operative field was prepped with chlorhexidinein a sterile fashion, and a sterile drape was applied covering the operative field. A sterile gown and sterile gloves were used for the procedure. Local anesthesia was provided with 1% Lidocaine. Under CT fluoroscopic guidance, a 19 gauge percutaneous entry needle was advanced into the left collection. Purulent material returned. Under CT fluoroscopic  guidance, a separate sterile 19 gauge percutaneous entry needle was advanced into the right retroperitoneal collection. Purulent material returned. An Amplatz guidewire advanced easily through the left needle. Tract dilated to facilitate placement of a 10 French pigtail catheter, formed centrally within the collection. In similar fashion, using separate and sterile equipment, an Amplatz guidewire advanced easily through the right needle. Tract dilated to facilitate placement of a 10 French pigtail catheter, formed centrally within the collection. 25 mL Samples of both aspirates were sent for Gram stain and culture. The catheters were secured externally with 0 Prolene suture and StatLock and placed to external gravity drain bags. The patient tolerated the procedure well. COMPLICATIONS: None immediate FINDINGS: The retroperitoneal collections were localized. Purulent material returned from both sites, so bilateral 10 French drain catheters were placed as above. IMPRESSION: 1. Bilateral retroperitoneal abscess drain catheter placement under CT guidance, as above Electronically Signed   By: Corlis Leak  Hassell M.D.   On: 12/17/2016 14:20   Ct Image Guided Drainage By Percutaneous Catheter  Result Date: 12/17/2016 CLINICAL DATA:  History of duodenal perforation. Bilateral posterior retroperitoneal fluid collections. EXAM: 1.  CT GUIDED DRAINAGE OF LEFT RETROPERITONEAL ABSCESS 2.  CT GUIDED DRAINAGE OF RIGHT RETROPERITONEAL ABSCESS ANESTHESIA/SEDATION: No additional IV sedation medication was required. PROCEDURE: The procedure, risks, benefits, and alternatives were explained to the patient. Questions regarding the procedure were encouraged and answered. The patient understands and consents to the procedure. Patient placed in right anterior oblique position. Select axial scans through the retroperitoneum performed. The bilateral collections were localized and appropriate skin entry sites determined and marked. The operative  field was prepped with chlorhexidinein a sterile fashion, and a sterile drape was applied covering the operative field. A  sterile gown and sterile gloves were used for the procedure. Local anesthesia was provided with 1% Lidocaine. Under CT fluoroscopic guidance, a 19 gauge percutaneous entry needle was advanced into the left collection. Purulent material returned. Under CT fluoroscopic guidance, a separate sterile 19 gauge percutaneous entry needle was advanced into the right retroperitoneal collection. Purulent material returned. An Amplatz guidewire advanced easily through the left needle. Tract dilated to facilitate placement of a 10 French pigtail catheter, formed centrally within the collection. In similar fashion, using separate and sterile equipment, an Amplatz guidewire advanced easily through the right needle. Tract dilated to facilitate placement of a 10 French pigtail catheter, formed centrally within the collection. 25 mL Samples of both aspirates were sent for Gram stain and culture. The catheters were secured externally with 0 Prolene suture and StatLock and placed to external gravity drain bags. The patient tolerated the procedure well. COMPLICATIONS: None immediate FINDINGS: The retroperitoneal collections were localized. Purulent material returned from both sites, so bilateral 10 French drain catheters were placed as above. IMPRESSION: 1. Bilateral retroperitoneal abscess drain catheter placement under CT guidance, as above Electronically Signed   By: Corlis Leak M.D.   On: 12/17/2016 14:20    Labs:  CBC:  Recent Labs  12/15/16 0509 12/16/16 0743 12/17/16 0524 12/19/16 1701  WBC 13.2* 7.9 10.8* 16.3*  HGB 9.5* 9.2* 9.3* 9.3*  HCT 30.9* 28.3* 29.1* 28.3*  PLT 214 195 183 PLATELET CLUMPS NOTED ON SMEAR, COUNT APPEARS ADEQUATE    COAGS:  Recent Labs  12/13/16 1007  INR 1.45  APTT 28    BMP:  Recent Labs  12/15/16 0509 12/16/16 0743 12/17/16 0524 12/19/16 1701  NA 145   145 144 145 137  K 5.6*  5.7* 4.2 3.7 2.6*  CL 119*  119* 116* 112* 103  CO2 10*  10* 10* 15* 19*  GLUCOSE 98  101* 176* 195* 145*  BUN 98*  97* 91* 79* 67*  CALCIUM 8.6*  8.7* 8.0* 7.9* 7.5*  CREATININE 4.70*  4.67* 5.05* 4.96* 5.21*  GFRNONAA 10*  11* 10* 10* 9*  GFRAA 12*  12* 11* 11* 11*    LIVER FUNCTION TESTS:  Recent Labs  12/15/16 0509 12/16/16 0743 12/17/16 0524 12/19/16 1701  BILITOT 0.7  --   --   --   AST 22  --   --   --   ALT 16*  --   --   --   ALKPHOS 83  --   --   --   PROT 6.4*  --   --   --   ALBUMIN 1.9*  1.9* 1.8* 1.6* 1.5*    Assessment and Plan:  B retroperitoneal abscess drain intact Left drain tubing has come apart from connection ---needs exchange Plan for am  Electronically Signed: Lyne Khurana A, PA-C 12/20/2016, 2:45 PM   I spent a total of 15 Minutes at the the patient's bedside AND on the patient's hospital floor or unit, greater than 50% of which was counseling/coordinating care for B RP abscess drains

## 2016-12-21 ENCOUNTER — Encounter (HOSPITAL_COMMUNITY): Payer: Self-pay | Admitting: Interventional Radiology

## 2016-12-21 ENCOUNTER — Other Ambulatory Visit (HOSPITAL_COMMUNITY): Payer: Self-pay

## 2016-12-21 HISTORY — PX: IR CATHETER TUBE CHANGE: IMG717

## 2016-12-21 LAB — RENAL FUNCTION PANEL
ANION GAP: 10 (ref 5–15)
Albumin: 1.5 g/dL — ABNORMAL LOW (ref 3.5–5.0)
BUN: 50 mg/dL — ABNORMAL HIGH (ref 6–20)
CO2: 19 mmol/L — AB (ref 22–32)
Calcium: 7.3 mg/dL — ABNORMAL LOW (ref 8.9–10.3)
Chloride: 106 mmol/L (ref 101–111)
Creatinine, Ser: 3.67 mg/dL — ABNORMAL HIGH (ref 0.61–1.24)
GFR, EST AFRICAN AMERICAN: 16 mL/min — AB (ref >60)
GFR, EST NON AFRICAN AMERICAN: 14 mL/min — AB (ref >60)
Glucose, Bld: 142 mg/dL — ABNORMAL HIGH (ref 65–99)
POTASSIUM: 2.9 mmol/L — AB (ref 3.5–5.1)
Phosphorus: 5.7 mg/dL — ABNORMAL HIGH (ref 2.5–4.6)
Sodium: 135 mmol/L (ref 135–145)

## 2016-12-21 LAB — CBC
HCT: 24.1 % — ABNORMAL LOW (ref 39.0–52.0)
Hemoglobin: 7.7 g/dL — ABNORMAL LOW (ref 13.0–17.0)
MCH: 27.8 pg (ref 26.0–34.0)
MCHC: 32 g/dL (ref 30.0–36.0)
MCV: 87 fL (ref 78.0–100.0)
PLATELETS: 177 10*3/uL (ref 150–400)
RBC: 2.77 MIL/uL — AB (ref 4.22–5.81)
RDW: 16.1 % — ABNORMAL HIGH (ref 11.5–15.5)
WBC: 15.7 10*3/uL — AB (ref 4.0–10.5)

## 2016-12-21 MED ORDER — LIDOCAINE HCL 1 % IJ SOLN
INTRAMUSCULAR | Status: AC
  Filled 2016-12-21: qty 20

## 2016-12-21 MED ORDER — IOPAMIDOL (ISOVUE-300) INJECTION 61%
INTRAVENOUS | Status: AC
  Administered 2016-12-21: 8 mL
  Filled 2016-12-21: qty 50

## 2016-12-21 MED ORDER — LIDOCAINE HCL 1 % IJ SOLN
INTRAMUSCULAR | Status: AC | PRN
  Administered 2016-12-21: 10 mL

## 2016-12-21 NOTE — Progress Notes (Signed)
[facility] [place] [place]  Subjective:   Patient underwent HD on Tuesday. No adverse events reported Had Peritoneal drains replaced Has intermittent episodes of moaning    Objective:  Vital signs in last 24 hours:     Temperature 98.1 P=102 RR 20 BP 123/53  Intake/Output:   No intake or output data in the 24 hours ending 12/21/16 1642   Physical Exam: General: NAD, laying in bed  HEENT anicteric   Neck Left IJ temp dialysis cathter  Pulm/lungs Coarse b/l,   CVS/Heart Regular, tachycardic  Abdomen:  Multiple drains in place, Dressing in Place., Foley cathter in place  Extremities: + heels in soft support, trace edema  Neurologic: Not following commands, moaning  Skin: Scattered bruises  Access: left IJ temporary doses catheter       Basic Metabolic Panel:   Recent Labs Lab 12/15/16 0509 12/16/16 0743 12/17/16 0524 12/19/16 1701 12/21/16 0820  NA 145  145 144 145 137 135  K 5.6*  5.7* 4.2 3.7 2.6* 2.9*  CL 119*  119* 116* 112* 103 106  CO2 10*  10* 10* 15* 19* 19*  GLUCOSE 98  101* 176* 195* 145* 142*  BUN 98*  97* 91* 79* 67* 50*  CREATININE 4.70*  4.67* 5.05* 4.96* 5.21* 3.67*  CALCIUM 8.6*  8.7* 8.0* 7.9* 7.5* 7.3*  MG 2.2 2.0 2.0  --   --   PHOS 8.8*  8.8* 8.7* 7.6* 7.4* 5.7*     CBC:  Recent Labs Lab 12/15/16 0509 12/16/16 0743 12/17/16 0524 12/19/16 1701 12/21/16 0825  WBC 13.2* 7.9 10.8* 16.3* 15.7*  NEUTROABS 10.6* 6.3  --   --   --   HGB 9.5* 9.2* 9.3* 9.3* 7.7*  HCT 30.9* 28.3* 29.1* 28.3* 24.1*  MCV 90.1 87.3 86.6 86.5 87.0  PLT 214 195 183 PLATELET CLUMPS NOTED ON SMEAR, COUNT APPEARS ADEQUATE 177      Lab Results  Component Value Date   HEPBSAG Negative 12/15/2016      Microbiology:  Recent Results (from the past 240 hour(s))  Culture, Urine     Status: None   Collection Time: 12/12/16  3:00 PM  Result Value Ref Range Status   Specimen Description URINE, RANDOM  Final   Special  Requests NONE  Final   Culture NO GROWTH  Final   Report Status 12/13/2016 FINAL  Final  Culture, blood (routine x 2)     Status: None   Collection Time: 12/12/16  3:23 PM  Result Value Ref Range Status   Specimen Description BLOOD LEFT ANTECUBITAL  Final   Special Requests IN PEDIATRIC BOTTLE Blood Culture adequate volume  Final   Culture NO GROWTH 5 DAYS  Final   Report Status 12/17/2016 FINAL  Final  Culture, blood (routine x 2)     Status: None   Collection Time: 12/12/16  3:25 PM  Result Value Ref Range Status   Specimen Description BLOOD LEFT HAND  Final   Special Requests IN PEDIATRIC BOTTLE Blood Culture adequate volume  Final   Culture NO GROWTH 5 DAYS  Final   Report Status 12/17/2016 FINAL  Final  Culture, expectorated sputum-assessment     Status: None   Collection Time: 12/14/16  2:05 PM  Result Value Ref Range Status   Specimen Description SPUTUM  Final   Special Requests NONE  Final   Sputum evaluation THIS SPECIMEN IS ACCEPTABLE FOR SPUTUM CULTURE  Final   Report Status 12/14/2016 FINAL  Final  Culture,  respiratory (NON-Expectorated)     Status: None   Collection Time: 12/14/16  2:05 PM  Result Value Ref Range Status   Specimen Description SPUTUM  Final   Special Requests NONE Reflexed from [id number]  Final   Gram Stain   Final    ABUNDANT WBC PRESENT,BOTH PMN AND MONONUCLEAR ABUNDANT GRAM POSITIVE COCCI IN PAIRS FEW GRAM NEGATIVE RODS    Culture   Final    ABUNDANT METHICILLIN RESISTANT STAPHYLOCOCCUS AUREUS   Report Status 12/17/2016 FINAL  Final   Organism ID, Bacteria METHICILLIN RESISTANT STAPHYLOCOCCUS AUREUS  Final      Susceptibility   Methicillin resistant staphylococcus aureus - MIC*    CIPROFLOXACIN <=0.5 SENSITIVE Sensitive     ERYTHROMYCIN <=0.25 SENSITIVE Sensitive     GENTAMICIN <=0.5 SENSITIVE Sensitive     OXACILLIN >=4 RESISTANT Resistant     TETRACYCLINE <=1 SENSITIVE Sensitive     VANCOMYCIN 1 SENSITIVE Sensitive     TRIMETH/SULFA <=10  SENSITIVE Sensitive     CLINDAMYCIN <=0.25 SENSITIVE Sensitive     RIFAMPIN <=0.5 SENSITIVE Sensitive     Inducible Clindamycin NEGATIVE Sensitive     * ABUNDANT METHICILLIN RESISTANT STAPHYLOCOCCUS AUREUS  Aerobic/Anaerobic Culture (surgical/deep wound)     Status: None (Preliminary result)   Collection Time: 12/17/16 12:38 PM  Result Value Ref Range Status   Specimen Description ABSCESS  Final   Special Requests LEFT RETROPERITONEAL  Final   Gram Stain NO WBC SEEN NO ORGANISMS SEEN   Final   Culture   Final    NO GROWTH 4 DAYS NO ANAEROBES ISOLATED; CULTURE IN PROGRESS FOR 5 DAYS   Report Status PENDING  Incomplete  Aerobic/Anaerobic Culture (surgical/deep wound)     Status: None (Preliminary result)   Collection Time: 12/17/16 12:39 PM  Result Value Ref Range Status   Specimen Description ABSCESS  Final   Special Requests RIGHT RETROPERITONEAL  Final   Gram Stain   Final    DEGENERATED CELLULAR MATERIAL PRESENT FEW GRAM POSITIVE COCCI IN CHAINS IN CLUSTERS    Culture   Final    FEW STAPHYLOCOCCUS AUREUS NO ANAEROBES ISOLATED; CULTURE IN PROGRESS FOR 5 DAYS    Report Status PENDING  Incomplete   Organism ID, Bacteria STAPHYLOCOCCUS AUREUS  Final      Susceptibility   Staphylococcus aureus - MIC*    CIPROFLOXACIN >=8 RESISTANT Resistant     ERYTHROMYCIN >=8 RESISTANT Resistant     GENTAMICIN <=0.5 SENSITIVE Sensitive     OXACILLIN 0.5 SENSITIVE Sensitive     TETRACYCLINE <=1 SENSITIVE Sensitive     VANCOMYCIN 1 SENSITIVE Sensitive     TRIMETH/SULFA <=10 SENSITIVE Sensitive     CLINDAMYCIN <=0.25 SENSITIVE Sensitive     RIFAMPIN <=0.5 SENSITIVE Sensitive     Inducible Clindamycin NEGATIVE Sensitive     * FEW STAPHYLOCOCCUS AUREUS    Coagulation Studies: No results for input(s): LABPROT, INR in the last 72 hours.  Urinalysis: No results for input(s): COLORURINE, LABSPEC, PHURINE, GLUCOSEU, HGBUR, BILIRUBINUR, KETONESUR, PROTEINUR, UROBILINOGEN, NITRITE, LEUKOCYTESUR  in the last 72 hours.  Invalid input(s): APPERANCEUR    Imaging: Ir Catheter Tube Change  Result Date: 12/21/2016 CLINICAL DATA:  Broken left retroperitoneal drain catheter, placed 12/17/2016 EXAM: LEFT RETROPERITONEAL ABSCESS DRAIN CATHETER EXCHANGE UNDER FLUOROSCOPY FLUOROSCOPY TIME:  18 seconds, 2 mGy TECHNIQUE: The left retroperitoneal pigtail abscess drain catheter surrounding skin were prepped with chlorhexidine, draped in usual sterile fashion. A small amount of contrast was injected through the catheter to opacify the  residual abscess cavity. The catheter was cut and exchanged over a 0.035" angiographic wire for a new 10-French pigtail catheter, formed centrally within the collection under fluoroscopy. Contrast injection confirms appropriate positioning. The catheter was then secured externally with a Statlock devices and additional 0 Prolene suture. Catheter placed external drain bag. The patient tolerated the procedure well. COMPLICATIONS: None. IMPRESSION: 1. Technically successful exchange of left retroperitoneal abscess drain catheter under fluoroscopy Electronically Signed   By: Corlis Leak M.D.   On: 12/21/2016 16:28     Medications:       Assessment/ Plan:  83 y.o.[place] male with history of coronary disease, left circumflex stent in 2007, COPD, diabetes type 2 insulin-dependent, hypertension, hyperlipidemia, BPH, peptic ulcer disease, right hip surgery, cholecystectomy, hernia repair, was admitted on 12/09/2016 for ongoing postsurgical care. Patient underwent ex - laparotomy for perforated duodenal ulcer 4/21 and repair of leak on 4/23. Hospital course complicated by acute respiratory failure requiring intubation, now extubated,  1. ARF, oliguric ATN likely from sepsis probably exacerbated by intravascular volume depletion Dialysis started on 12/16/16 for severe azotemia and acdosis Mental status had seemed to have improved with dialysis but patient is moaning today. Seen  after procedure (?anesthesia) but family reports that his mental status has worsened  Plan to continue dialysis for now Next treatment on Friday  2.  Acidosis Expected to correct with hemodialysis bicarbonate of 19 (improved with HD)  3. Hyperkalemia -  Improved with HD - Now hypokalemia - 4 K dialysate  4. Altered mental status ? Sepsis vs pan meds  5. Hyperphosphatemia - use low phos tube feeds - improving with HD slowly  6. Status post exploratory laparotomy for perforated duodenal ulcer   LOS: 0 [facility] 5/23/20184:42 PM  [facility] [place], [place] [phone]

## 2016-12-22 LAB — AEROBIC/ANAEROBIC CULTURE W GRAM STAIN (SURGICAL/DEEP WOUND): Culture: NO GROWTH

## 2016-12-22 LAB — AEROBIC/ANAEROBIC CULTURE (SURGICAL/DEEP WOUND): GRAM STAIN: NONE SEEN

## 2016-12-23 LAB — RENAL FUNCTION PANEL
ALBUMIN: 1.6 g/dL — AB (ref 3.5–5.0)
ANION GAP: 12 (ref 5–15)
BUN: 68 mg/dL — ABNORMAL HIGH (ref 6–20)
CALCIUM: 8 mg/dL — AB (ref 8.9–10.3)
CO2: 17 mmol/L — AB (ref 22–32)
Chloride: 106 mmol/L (ref 101–111)
Creatinine, Ser: 4.34 mg/dL — ABNORMAL HIGH (ref 0.61–1.24)
GFR, EST AFRICAN AMERICAN: 13 mL/min — AB (ref >60)
GFR, EST NON AFRICAN AMERICAN: 12 mL/min — AB (ref >60)
Glucose, Bld: 187 mg/dL — ABNORMAL HIGH (ref 65–99)
PHOSPHORUS: 9.1 mg/dL — AB (ref 2.5–4.6)
Potassium: 3.4 mmol/L — ABNORMAL LOW (ref 3.5–5.1)
SODIUM: 135 mmol/L (ref 135–145)

## 2016-12-23 LAB — CBC
HCT: 26 % — ABNORMAL LOW (ref 39.0–52.0)
HEMOGLOBIN: 8.2 g/dL — AB (ref 13.0–17.0)
MCH: 27.4 pg (ref 26.0–34.0)
MCHC: 31.5 g/dL (ref 30.0–36.0)
MCV: 87 fL (ref 78.0–100.0)
PLATELETS: 286 10*3/uL (ref 150–400)
RBC: 2.99 MIL/uL — AB (ref 4.22–5.81)
RDW: 16.2 % — ABNORMAL HIGH (ref 11.5–15.5)
WBC: 21.5 10*3/uL — AB (ref 4.0–10.5)

## 2016-12-23 NOTE — Progress Notes (Signed)
Referring Physician(s):  Dr. Ardeth Sportsman  Supervising Physician: Gilmer Mor  Patient Status:  [facility] - In-pt  Chief Complaint:  Retroperitoneal abscess drain  Subjective: Alert, moaning.  No eye contact.  Retroperitoneal drains in place. Left drain replaced 5/23.  Allergies: Patient has no allergy information on record.  Medications: Prior to Admission medications   Not on File     Vital Signs: BP (!) 82/53   Pulse (!) 115   Resp (!) 33   SpO2 99%   Physical Exam  Constitutional: He appears well-developed.  Pulmonary/Chest: Effort normal. No respiratory distress.  Abdominal:  Retroperitoneal drains in place bilaterally.  Left drain with tan, purulent output.  Right drain with tan, seedy output with black flecks.  Neurological: He is alert.  Moans, no attempt at communication  Skin: Skin is warm and dry.  Nursing note and vitals reviewed.   Imaging: Ir Catheter Tube Change  Result Date: 12/21/2016 CLINICAL DATA:  Broken left retroperitoneal drain catheter, placed 12/17/2016 EXAM: LEFT RETROPERITONEAL ABSCESS DRAIN CATHETER EXCHANGE UNDER FLUOROSCOPY FLUOROSCOPY TIME:  18 seconds, 2 mGy TECHNIQUE: The left retroperitoneal pigtail abscess drain catheter surrounding skin were prepped with chlorhexidine, draped in usual sterile fashion. A small amount of contrast was injected through the catheter to opacify the residual abscess cavity. The catheter was cut and exchanged over a 0.035" angiographic wire for a new 10-French pigtail catheter, formed centrally within the collection under fluoroscopy. Contrast injection confirms appropriate positioning. The catheter was then secured externally with a Statlock devices and additional 0 Prolene suture. Catheter placed external drain bag. The patient tolerated the procedure well. COMPLICATIONS: None. IMPRESSION: 1. Technically successful exchange of left retroperitoneal abscess drain catheter under fluoroscopy Electronically Signed   By:  Corlis Leak M.D.   On: 12/21/2016 16:28    Labs:  CBC:  Recent Labs  12/17/16 0524 12/19/16 1701 12/21/16 0825 12/23/16 0730  WBC 10.8* 16.3* 15.7* 21.5*  HGB 9.3* 9.3* 7.7* 8.2*  HCT 29.1* 28.3* 24.1* 26.0*  PLT 183 PLATELET CLUMPS NOTED ON SMEAR, COUNT APPEARS ADEQUATE 177 286    COAGS:  Recent Labs  12/13/16 1007  INR 1.45  APTT 28    BMP:  Recent Labs  12/17/16 0524 12/19/16 1701 12/21/16 0820 12/23/16 0500  NA 145 137 135 135  K 3.7 2.6* 2.9* 3.4*  CL 112* 103 106 106  CO2 15* 19* 19* 17*  GLUCOSE 195* 145* 142* 187*  BUN 79* 67* 50* 68*  CALCIUM 7.9* 7.5* 7.3* 8.0*  CREATININE 4.96* 5.21* 3.67* 4.34*  GFRNONAA 10* 9* 14* 12*  GFRAA 11* 11* 16* 13*    LIVER FUNCTION TESTS:  Recent Labs  12/15/16 0509  12/17/16 0524 12/19/16 1701 12/21/16 0820 12/23/16 0500  BILITOT 0.7  --   --   --   --   --   AST 22  --   --   --   --   --   ALT 16*  --   --   --   --   --   ALKPHOS 83  --   --   --   --   --   PROT 6.4*  --   --   --   --   --   ALBUMIN 1.9*  1.9*  < > 1.6* 1.5* 1.5* 1.6*  < > = values in this interval not displayed.  Assessment and Plan: Retroperitoneal abscess Patient with multiple drains placed prior to admission.  IR has  replaced J-tube as well as placed HD catheter.  Patient also now s/p bilateral retroperitoneal catheter placement 5/19.  Left drain was replaced due to broken tubing on 5/23. Drains assessed this AM.  Both intact with significant amount of output.  -Left drain with purulent, tan output -Right drain with purulent, seedy output with black/dark flecks.  Continue drain care. IR to follow.   Electronically Signed: Hoyt KochKacie Sue-Ellen Dreshawn Hendershott, PA 12/23/2016, 10:24 AM   I spent a total of 15 Minutes at the the patient's bedside AND on the patient's hospital floor or unit, greater than 50% of which was counseling/coordinating care for retroperitoneal fluid collections.

## 2016-12-23 NOTE — Progress Notes (Signed)
[facility] [place], [place] 12/23/16  Subjective:   Patient seen during HD. BFR poor 200-250. High arterial pressures He is awake today but moaning loudly at times Able to answer yes no to simple questions and able to respond to name No moving left side; speech appears slurred    Objective:  Vital signs in last 24 hours:     Temperature  98.3  P 93  R 36  BP 113/59  Intake/Output:   No intake or output data in the 24 hours ending 12/23/16 0950   Physical Exam: General: NAD, laying in bed  HEENT anicteric , mouth dry  Neck Left IJ temp dialysis cathter  Pulm/lungs Coarse b/l,   CVS/Heart Regular, tachycardic  Abdomen:  Multiple drains in place, Dressing in Place., Foley cathter in place  Extremities: + heels in soft support, trace edema  Neurologic: following basic commands, moaning  Skin: Scattered bruises  Access: left IJ temporary doses catheter       Basic Metabolic Panel:   Recent Labs Lab 12/17/16 0524 12/19/16 1701 12/21/16 0820 12/23/16 0500  NA 145 137 135 135  K 3.7 2.6* 2.9* 3.4*  CL 112* 103 106 106  CO2 15* 19* 19* 17*  GLUCOSE 195* 145* 142* 187*  BUN 79* 67* 50* 68*  CREATININE 4.96* 5.21* 3.67* 4.34*  CALCIUM 7.9* 7.5* 7.3* 8.0*  MG 2.0  --   --   --   PHOS 7.6* 7.4* 5.7* 9.1*     CBC:  Recent Labs Lab 12/17/16 0524 12/19/16 1701 12/21/16 0825 12/23/16 0730  WBC 10.8* 16.3* 15.7* 21.5*  HGB 9.3* 9.3* 7.7* 8.2*  HCT 29.1* 28.3* 24.1* 26.0*  MCV 86.6 86.5 87.0 87.0  PLT 183 PLATELET CLUMPS NOTED ON SMEAR, COUNT APPEARS ADEQUATE 177 286      Lab Results  Component Value Date   HEPBSAG Negative 12/15/2016      Microbiology:  Recent Results (from the past 240 hour(s))  Culture, expectorated sputum-assessment     Status: None   Collection Time: 12/14/16  2:05 PM  Result Value Ref Range Status   Specimen Description SPUTUM  Final   Special Requests NONE  Final   Sputum evaluation THIS SPECIMEN IS  ACCEPTABLE FOR SPUTUM CULTURE  Final   Report Status 12/14/2016 FINAL  Final  Culture, respiratory (NON-Expectorated)     Status: None   Collection Time: 12/14/16  2:05 PM  Result Value Ref Range Status   Specimen Description SPUTUM  Final   Special Requests NONE Reflexed from [id number]  Final   Gram Stain   Final    ABUNDANT WBC PRESENT,BOTH PMN AND MONONUCLEAR ABUNDANT GRAM POSITIVE COCCI IN PAIRS FEW GRAM NEGATIVE RODS    Culture   Final    ABUNDANT METHICILLIN RESISTANT STAPHYLOCOCCUS AUREUS   Report Status 12/17/2016 FINAL  Final   Organism ID, Bacteria METHICILLIN RESISTANT STAPHYLOCOCCUS AUREUS  Final      Susceptibility   Methicillin resistant staphylococcus aureus - MIC*    CIPROFLOXACIN <=0.5 SENSITIVE Sensitive     ERYTHROMYCIN <=0.25 SENSITIVE Sensitive     GENTAMICIN <=0.5 SENSITIVE Sensitive     OXACILLIN >=4 RESISTANT Resistant     TETRACYCLINE <=1 SENSITIVE Sensitive     VANCOMYCIN 1 SENSITIVE Sensitive     TRIMETH/SULFA <=10 SENSITIVE Sensitive     CLINDAMYCIN <=0.25 SENSITIVE Sensitive     RIFAMPIN <=0.5 SENSITIVE Sensitive     Inducible Clindamycin NEGATIVE Sensitive     * ABUNDANT METHICILLIN RESISTANT STAPHYLOCOCCUS AUREUS  Aerobic/Anaerobic Culture (surgical/deep wound)     Status: None   Collection Time: 12/17/16 12:38 PM  Result Value Ref Range Status   Specimen Description ABSCESS  Final   Special Requests LEFT RETROPERITONEAL  Final   Gram Stain NO WBC SEEN NO ORGANISMS SEEN   Final   Culture No growth aerobically or anaerobically.  Final   Report Status 12/22/2016 FINAL  Final  Aerobic/Anaerobic Culture (surgical/deep wound)     Status: None   Collection Time: 12/17/16 12:39 PM  Result Value Ref Range Status   Specimen Description ABSCESS  Final   Special Requests RIGHT RETROPERITONEAL  Final   Gram Stain   Final    DEGENERATED CELLULAR MATERIAL PRESENT FEW GRAM POSITIVE COCCI IN CHAINS IN CLUSTERS    Culture FEW STAPHYLOCOCCUS AUREUS NO  ANAEROBES ISOLATED   Final   Report Status 12/22/2016 FINAL  Final   Organism ID, Bacteria STAPHYLOCOCCUS AUREUS  Final      Susceptibility   Staphylococcus aureus - MIC*    CIPROFLOXACIN >=8 RESISTANT Resistant     ERYTHROMYCIN >=8 RESISTANT Resistant     GENTAMICIN <=0.5 SENSITIVE Sensitive     OXACILLIN 0.5 SENSITIVE Sensitive     TETRACYCLINE <=1 SENSITIVE Sensitive     VANCOMYCIN 1 SENSITIVE Sensitive     TRIMETH/SULFA <=10 SENSITIVE Sensitive     CLINDAMYCIN <=0.25 SENSITIVE Sensitive     RIFAMPIN <=0.5 SENSITIVE Sensitive     Inducible Clindamycin NEGATIVE Sensitive     * FEW STAPHYLOCOCCUS AUREUS    Coagulation Studies: No results for input(s): LABPROT, INR in the last 72 hours.  Urinalysis: No results for input(s): COLORURINE, LABSPEC, PHURINE, GLUCOSEU, HGBUR, BILIRUBINUR, KETONESUR, PROTEINUR, UROBILINOGEN, NITRITE, LEUKOCYTESUR in the last 72 hours.  Invalid input(s): APPERANCEUR    Imaging: Ir Catheter Tube Change  Result Date: 12/21/2016 CLINICAL DATA:  Broken left retroperitoneal drain catheter, placed 12/17/2016 EXAM: LEFT RETROPERITONEAL ABSCESS DRAIN CATHETER EXCHANGE UNDER FLUOROSCOPY FLUOROSCOPY TIME:  18 seconds, 2 mGy TECHNIQUE: The left retroperitoneal pigtail abscess drain catheter surrounding skin were prepped with chlorhexidine, draped in usual sterile fashion. A small amount of contrast was injected through the catheter to opacify the residual abscess cavity. The catheter was cut and exchanged over a 0.035" angiographic wire for a new 10-French pigtail catheter, formed centrally within the collection under fluoroscopy. Contrast injection confirms appropriate positioning. The catheter was then secured externally with a Statlock devices and additional 0 Prolene suture. Catheter placed external drain bag. The patient tolerated the procedure well. COMPLICATIONS: None. IMPRESSION: 1. Technically successful exchange of left retroperitoneal abscess drain catheter  under fluoroscopy Electronically Signed   By: Corlis Leak  Hassell M.D.   On: 12/21/2016 16:28     Medications:       Assessment/ Plan:  83 y.o.[place] male with history of coronary disease, left circumflex stent in 2007, COPD, diabetes type 2 insulin-dependent, hypertension, hyperlipidemia, BPH, peptic ulcer disease, right hip surgery, cholecystectomy, hernia repair, was admitted on 12/09/2016 for ongoing postsurgical care. Patient underwent ex - laparotomy for perforated duodenal ulcer 4/21 and repair of leak on 4/23. Hospital course complicated by acute respiratory failure requiring intubation, now extubated,  1. ARF, oliguric ATN likely from sepsis probably exacerbated by intravascular volume depletion Dialysis started on 12/16/16 for severe azotemia and acdosis  Plan to continue dialysis for now Next treatment on monday. Poor BFR through cathter Consider Radiology consult on Monday or Tuesday for replacing cathter. Will leave it in for the weekend for now  2.  Acidosis  Expected to correct with hemodialysis  3. Hyperkalemia -  Improved with HD - Now hypokalemia - 4 K dialysate  4. Altered mental status ? Sepsis vs pain meds  5. Hyperphosphatemia - use low phos tube feeds  6. Status post exploratory laparotomy for perforated duodenal ulcer   LOS: 0 [facility] 5/25/20189:50 AM  [facility] [place], [place] [phone]

## 2016-12-26 ENCOUNTER — Other Ambulatory Visit (HOSPITAL_COMMUNITY): Payer: Self-pay

## 2016-12-26 LAB — RENAL FUNCTION PANEL
ANION GAP: 18 — AB (ref 5–15)
Albumin: 1.6 g/dL — ABNORMAL LOW (ref 3.5–5.0)
BUN: 101 mg/dL — ABNORMAL HIGH (ref 6–20)
CHLORIDE: 108 mmol/L (ref 101–111)
CO2: 13 mmol/L — AB (ref 22–32)
Calcium: 8.4 mg/dL — ABNORMAL LOW (ref 8.9–10.3)
Creatinine, Ser: 4.48 mg/dL — ABNORMAL HIGH (ref 0.61–1.24)
GFR, EST AFRICAN AMERICAN: 13 mL/min — AB (ref >60)
GFR, EST NON AFRICAN AMERICAN: 11 mL/min — AB (ref >60)
Glucose, Bld: 234 mg/dL — ABNORMAL HIGH (ref 65–99)
POTASSIUM: 4.2 mmol/L (ref 3.5–5.1)
Phosphorus: 11.1 mg/dL — ABNORMAL HIGH (ref 2.5–4.6)
Sodium: 139 mmol/L (ref 135–145)

## 2016-12-26 LAB — CBC
HEMATOCRIT: 28.9 % — AB (ref 39.0–52.0)
Hemoglobin: 9.2 g/dL — ABNORMAL LOW (ref 13.0–17.0)
MCH: 28 pg (ref 26.0–34.0)
MCHC: 31.8 g/dL (ref 30.0–36.0)
MCV: 88.1 fL (ref 78.0–100.0)
PLATELETS: 403 10*3/uL — AB (ref 150–400)
RBC: 3.28 MIL/uL — AB (ref 4.22–5.81)
RDW: 16.8 % — ABNORMAL HIGH (ref 11.5–15.5)
WBC: 17.5 10*3/uL — AB (ref 4.0–10.5)

## 2016-12-26 NOTE — Progress Notes (Signed)
[facility] [place], [place] 12/26/16  Subjective:  Patient not conversant. Moans out in pain. Due for hemodialysis today.     Objective:  Vital signs in last 24 hours:  Temperature 98.1 pulse 84 respirations 20 blood pressure 100/51   Physical Exam: General: NAD, laying in bed  HEENT anicteric , mouth dry  Neck supple  Pulm/lungs Coarse b/l, normal effort  CVS/Heart S1S2 no rubs  Abdomen:  Multiple drains in place, mild distension  Extremities: + heels in soft support, trace edema  Neurologic: following basic commands, moaning  Skin: Scattered ecchymoses  Access: left IJ temporary dialysis catheter       Basic Metabolic Panel:   Recent Labs Lab 12/19/16 1701 12/21/16 0820 12/23/16 0500 12/26/16 0754  NA 137 135 135 139  K 2.6* 2.9* 3.4* 4.2  CL 103 106 106 108  CO2 19* 19* 17* 13*  GLUCOSE 145* 142* 187* 234*  BUN 67* 50* 68* 101*  CREATININE 5.21* 3.67* 4.34* 4.48*  CALCIUM 7.5* 7.3* 8.0* 8.4*  PHOS 7.4* 5.7* 9.1* 11.1*     CBC:  Recent Labs Lab 12/19/16 1701 12/21/16 0825 12/23/16 0730 12/26/16 0754  WBC 16.3* 15.7* 21.5* 17.5*  HGB 9.3* 7.7* 8.2* 9.2*  HCT 28.3* 24.1* 26.0* 28.9*  MCV 86.5 87.0 87.0 88.1  PLT PLATELET CLUMPS NOTED ON SMEAR, COUNT APPEARS ADEQUATE 177 286 403*      Lab Results  Component Value Date   HEPBSAG Negative 12/15/2016      Microbiology:  Recent Results (from the past 240 hour(s))  Aerobic/Anaerobic Culture (surgical/deep wound)     Status: None   Collection Time: 12/17/16 12:38 PM  Result Value Ref Range Status   Specimen Description ABSCESS  Final   Special Requests LEFT RETROPERITONEAL  Final   Gram Stain NO WBC SEEN NO ORGANISMS SEEN   Final   Culture No growth aerobically or anaerobically.  Final   Report Status 12/22/2016 FINAL  Final  Aerobic/Anaerobic Culture (surgical/deep wound)     Status: None   Collection Time: 12/17/16 12:39 PM  Result Value Ref Range Status   Specimen  Description ABSCESS  Final   Special Requests RIGHT RETROPERITONEAL  Final   Gram Stain   Final    DEGENERATED CELLULAR MATERIAL PRESENT FEW GRAM POSITIVE COCCI IN CHAINS IN CLUSTERS    Culture FEW STAPHYLOCOCCUS AUREUS NO ANAEROBES ISOLATED   Final   Report Status 12/22/2016 FINAL  Final   Organism ID, Bacteria STAPHYLOCOCCUS AUREUS  Final      Susceptibility   Staphylococcus aureus - MIC*    CIPROFLOXACIN >=8 RESISTANT Resistant     ERYTHROMYCIN >=8 RESISTANT Resistant     GENTAMICIN <=0.5 SENSITIVE Sensitive     OXACILLIN 0.5 SENSITIVE Sensitive     TETRACYCLINE <=1 SENSITIVE Sensitive     VANCOMYCIN 1 SENSITIVE Sensitive     TRIMETH/SULFA <=10 SENSITIVE Sensitive     CLINDAMYCIN <=0.25 SENSITIVE Sensitive     RIFAMPIN <=0.5 SENSITIVE Sensitive     Inducible Clindamycin NEGATIVE Sensitive     * FEW STAPHYLOCOCCUS AUREUS    Coagulation Studies: No results for input(s): LABPROT, INR in the last 72 hours.  Urinalysis: No results for input(s): COLORURINE, LABSPEC, PHURINE, GLUCOSEU, HGBUR, BILIRUBINUR, KETONESUR, PROTEINUR, UROBILINOGEN, NITRITE, LEUKOCYTESUR in the last 72 hours.  Invalid input(s): APPERANCEUR    Imaging: Dg Abd Portable 1v  Result Date: 12/26/2016 CLINICAL DATA:  Abdominal distension.  Postop duodenal ulcer repair. EXAM: PORTABLE ABDOMEN - 1 VIEW COMPARISON:  None.  FINDINGS: There are 2 multipurpose catheter is projecting over the right and left mid abdomen. There is no bowel dilatation to suggest obstruction. There is no evidence of pneumoperitoneum, portal venous gas or pneumatosis. There are no pathologic calcifications along the expected course of the ureters. There is a right hip arthroplasty. There is a dextrocurvature of the thoracolumbar spine. There is lumbar spine spondylosis. IMPRESSION: No acute abnormality. Two drainage catheters in unchanged position in the retroperitoneum. Electronically Signed   By: Elige KoHetal  Patel   On: 12/26/2016 08:24      Medications:       Assessment/ Plan:  83 y.o.caucasian male with history of coronary disease, left circumflex stent in 2007, COPD, diabetes type 2 insulin-dependent, hypertension, hyperlipidemia, BPH, peptic ulcer disease, right hip surgery, cholecystectomy, hernia repair, was admitted on 12/09/2016 for ongoing postsurgical care. Patient underwent ex - laparotomy for perforated duodenal ulcer 4/21 and repair of leak on 4/23. Hospital course complicated by acute respiratory failure requiring intubation, now extubated,  1. ARF, oliguric -Patient due for hemodialysis today. He has been having some dialysis catheter dysfunction. If we continue to have a problem today we will consider replacing his dialysis catheter later this week.  2.  Acidosis Serum bicarbonate quite low at 13. Hopefully this will correct with hemodialysis today.  3. Hyperkalemia -  This has improved. Serum potassium 4.2 at the moment. Continue to monitor.  4. Altered mental status - overall patient remains quite confused. He is nonconversant.  Uremia maybe playing a role at the moment as BUN is quite high.    5. Hyperphosphatemia - phosphorus very high today at 1.1. Hopefully this will decline with dialysis today. We may need to consider using liquid phosphorus binder.  6. Status post exploratory laparotomy for perforated duodenal ulcer   LOS: 0 Patrick Gomez 5/28/201811:18 AM  [facility] [place], [place] [phone]

## 2016-12-27 ENCOUNTER — Other Ambulatory Visit (HOSPITAL_COMMUNITY): Payer: Self-pay

## 2016-12-27 NOTE — Progress Notes (Signed)
Referring Physician(s):  Dr. Ardeth SportsmanA Hijazi  Supervising Physician: Gilmer MorWagner, Jaime  Patient Status:  [facility]  Chief Complaint:  Retroperitoneal abscess drain  Subjective: Sleepy.  Moans when awakened.  Patient with right JP, duodenostomy, G-tube, surgical J-tube replaced in IR 5/17. Retroperitoneal abscess drains placed 5/19. Left drain replaced 5/23.  Allergies: Patient has no allergy information on record.  Medications: Prior to Admission medications   Not on File     Vital Signs: BP (!) 82/53   Pulse (!) 115   Resp (!) 33   SpO2 99%   Physical Exam  Constitutional: He appears well-developed.  Pulmonary/Chest: Effort normal. No respiratory distress.  Abdominal:  Retroperitoneal drains in place bilaterally.  Left drain with tan, purulent output.  Right drain with tan, seedy output with black flecks.  Neurological: He is alert.  Moans, no attempt at communication  Skin: Skin is warm and dry.  Nursing note and vitals reviewed.   Imaging: Dg Chest Port 1 View  Result Date: 12/27/2016 CLINICAL DATA:  Respiratory failure EXAM: PORTABLE CHEST 1 VIEW COMPARISON:  Chest x-ray of Dec 14, 2016. FINDINGS: The lungs are adequately inflated. There is no alveolar infiltrate. Parenchymal density persists in the right apex and in the left mid to upper lung. There is no pneumothorax or pneumomediastinum. The heart and pulmonary vascularity are normal. There is calcification in the wall of the aortic arch. The observed bony thorax exhibits no acute abnormality. IMPRESSION: Chronic bronchitic changes, stable. Stable parenchymal densities in the right apex and in the left upper lobe. Thoracic aortic atherosclerosis. Electronically Signed   By: David  Swazil[place]Jordan M.D.   On: 12/27/2016 10:27   Dg Abd Portable 1v  Result Date: 12/26/2016 CLINICAL DATA:  Abdominal distension.  Postop duodenal ulcer repair. EXAM: PORTABLE ABDOMEN - 1 VIEW COMPARISON:  None. FINDINGS: There are 2 multipurpose  catheter is projecting over the right and left mid abdomen. There is no bowel dilatation to suggest obstruction. There is no evidence of pneumoperitoneum, portal venous gas or pneumatosis. There are no pathologic calcifications along the expected course of the ureters. There is a right hip arthroplasty. There is a dextrocurvature of the thoracolumbar spine. There is lumbar spine spondylosis. IMPRESSION: No acute abnormality. Two drainage catheters in unchanged position in the retroperitoneum. Electronically Signed   By: Elige KoHetal  Patel   On: 12/26/2016 08:24    Labs:  CBC:  Recent Labs  12/19/16 1701 12/21/16 0825 12/23/16 0730 12/26/16 0754  WBC 16.3* 15.7* 21.5* 17.5*  HGB 9.3* 7.7* 8.2* 9.2*  HCT 28.3* 24.1* 26.0* 28.9*  PLT PLATELET CLUMPS NOTED ON SMEAR, COUNT APPEARS ADEQUATE 177 286 403*    COAGS:  Recent Labs  12/13/16 1007  INR 1.45  APTT 28    BMP:  Recent Labs  12/19/16 1701 12/21/16 0820 12/23/16 0500 12/26/16 0754  NA 137 135 135 139  K 2.6* 2.9* 3.4* 4.2  CL 103 106 106 108  CO2 19* 19* 17* 13*  GLUCOSE 145* 142* 187* 234*  BUN 67* 50* 68* 101*  CALCIUM 7.5* 7.3* 8.0* 8.4*  CREATININE 5.21* 3.67* 4.34* 4.48*  GFRNONAA 9* 14* 12* 11*  GFRAA 11* 16* 13* 13*    LIVER FUNCTION TESTS:  Recent Labs  12/15/16 0509  12/19/16 1701 12/21/16 0820 12/23/16 0500 12/26/16 0754  BILITOT 0.7  --   --   --   --   --   AST 22  --   --   --   --   --  ALT 16*  --   --   --   --   --   ALKPHOS 83  --   --   --   --   --   PROT 6.4*  --   --   --   --   --   ALBUMIN 1.9*  1.9*  < > 1.5* 1.5* 1.6* 1.6*  < > = values in this interval not displayed.  Assessment and Plan: Retroperitoneal abscess Patient with multiple drains placed prior to admission.  IR has replaced J-tube as well as placed HD catheter.  Patient also now s/p bilateral retroperitoneal catheter placement 5/19.  Left drain was replaced due to broken tubing on 5/23. Drains remain in place.  Both  retroperitoneal drains intact with continued output, left > right. -Left drain with purulent, tan output of about 60 mL -Right drain with purulent output, flecks, about 20 mL Continue drain care. IR to follow.   Electronically Signed: Hoyt Koch, PA 12/27/2016, 1:02 PM   I spent a total of 15 Minutes at the the patient's bedside AND on the patient's hospital floor or unit, greater than 50% of which was counseling/coordinating care for retroperitoneal fluid collections.

## 2016-12-28 LAB — CBC
HEMATOCRIT: 29.9 % — AB (ref 39.0–52.0)
Hemoglobin: 9.2 g/dL — ABNORMAL LOW (ref 13.0–17.0)
MCH: 27.5 pg (ref 26.0–34.0)
MCHC: 30.8 g/dL (ref 30.0–36.0)
MCV: 89.3 fL (ref 78.0–100.0)
Platelets: 424 10*3/uL — ABNORMAL HIGH (ref 150–400)
RBC: 3.35 MIL/uL — ABNORMAL LOW (ref 4.22–5.81)
RDW: 18.2 % — AB (ref 11.5–15.5)
WBC: 12.8 10*3/uL — ABNORMAL HIGH (ref 4.0–10.5)

## 2016-12-28 LAB — RENAL FUNCTION PANEL
ALBUMIN: 1.6 g/dL — AB (ref 3.5–5.0)
Anion gap: 16 — ABNORMAL HIGH (ref 5–15)
BUN: 130 mg/dL — AB (ref 6–20)
CO2: 13 mmol/L — ABNORMAL LOW (ref 22–32)
Calcium: 8.2 mg/dL — ABNORMAL LOW (ref 8.9–10.3)
Chloride: 110 mmol/L (ref 101–111)
Creatinine, Ser: 4.6 mg/dL — ABNORMAL HIGH (ref 0.61–1.24)
GFR calc Af Amer: 12 mL/min — ABNORMAL LOW (ref >60)
GFR calc non Af Amer: 11 mL/min — ABNORMAL LOW (ref >60)
GLUCOSE: 262 mg/dL — AB (ref 65–99)
PHOSPHORUS: 12.9 mg/dL — AB (ref 2.5–4.6)
POTASSIUM: 4 mmol/L (ref 3.5–5.1)
Sodium: 139 mmol/L (ref 135–145)

## 2016-12-28 NOTE — Progress Notes (Signed)
[facility] [place] [place] 12/28/16  Subjective:  Patient seen at bedside. Dialysis catheter to be replaced tomorrow. PermCath has been requested. We will plan for dialysis again on Thursday and Friday. Patient's wife at bedside and also updated.    Objective:  Vital signs in last 24 hours:  Temperature 96.8 pulse 80 respirations 15 blood pressure 92/50   Physical Exam: General: NAD, laying in bed  HEENT anicteric , mouth dry  Neck supple  Pulm/lungs Coarse b/l, normal effort  CVS/Heart S1S2 no rubs  Abdomen:  Multiple drains in place, mild distension  Extremities: + heels in soft support, trace edema  Neurologic: Arousable, not consistently following commands  Skin: Scattered ecchymoses  Access: left IJ temporary dialysis catheter       Basic Metabolic Panel:   Recent Labs Lab 12/23/16 0500 12/26/16 0754 12/28/16 0704  NA 135 139 139  K 3.4* 4.2 4.0  CL 106 108 110  CO2 17* 13* 13*  GLUCOSE 187* 234* 262*  BUN 68* 101* 130*  CREATININE 4.34* 4.48* 4.60*  CALCIUM 8.0* 8.4* 8.2*  PHOS 9.1* 11.1* 12.9*     CBC:  Recent Labs Lab 12/23/16 0730 12/26/16 0754 12/28/16 0705  WBC 21.5* 17.5* 12.8*  HGB 8.2* 9.2* 9.2*  HCT 26.0* 28.9* 29.9*  MCV 87.0 88.1 89.3  PLT 286 403* 424*      Lab Results  Component Value Date   HEPBSAG Negative 12/15/2016      Microbiology:  No results found for this or any previous visit (from the past 240 hour(s)).  Coagulation Studies: No results for input(s): LABPROT, INR in the last 72 hours.  Urinalysis: No results for input(s): COLORURINE, LABSPEC, PHURINE, GLUCOSEU, HGBUR, BILIRUBINUR, KETONESUR, PROTEINUR, UROBILINOGEN, NITRITE, LEUKOCYTESUR in the last 72 hours.  Invalid input(s): APPERANCEUR    Imaging: Dg Chest Port 1 View  Result Date: 12/27/2016 CLINICAL DATA:  Respiratory failure EXAM: PORTABLE CHEST 1 VIEW COMPARISON:  Chest x-ray of Dec 14, 2016. FINDINGS: The lungs are adequately  inflated. There is no alveolar infiltrate. Parenchymal density persists in the right apex and in the left mid to upper lung. There is no pneumothorax or pneumomediastinum. The heart and pulmonary vascularity are normal. There is calcification in the wall of the aortic arch. The observed bony thorax exhibits no acute abnormality. IMPRESSION: Chronic bronchitic changes, stable. Stable parenchymal densities in the right apex and in the left upper lobe. Thoracic aortic atherosclerosis. Electronically Signed   By: David  Swazil[place]Jordan M.D.   On: 12/27/2016 10:27     Medications:       Assessment/ Plan:  83 y.o.caucasian male with history of coronary disease, left circumflex stent in 2007, COPD, diabetes type 2 insulin-dependent, hypertension, hyperlipidemia, BPH, peptic ulcer disease, right hip surgery, cholecystectomy, hernia repair, was admitted on 12/09/2016 for ongoing postsurgical care. Patient underwent ex - laparotomy for perforated duodenal ulcer 4/21 and repair of leak on 4/23. Hospital course complicated by acute respiratory failure requiring intubation, now extubated,  1. ARF, oliguric -patient's dialysis catheter did not work very well during his last dialysis treatment. Therefore he will need to have the catheter replaced. We will request PermCath if possible. Consultation with interventional radiology placed.  2.  Acidosis Serum bicarbonate remains quite low at 13.This should begin to correct with dialysis.  3. Hyperkalemia -  Serum potassium currently 4.0 an acceptable rate continue to monitor.  4. Altered mental status - BUN up to 130. We will plan for dialysis x1.5 hours tomorrow to slowly  bring down his BUN.  5. Hyperphosphatemia - phosphorus currently up to 12.9. This should begin to come down with reinitiation of dialysis.  6. Status post exploratory laparotomy for perforated duodenal ulcer   LOS: 0 Detrice Cales 5/30/20185:28 PM  [facility] [place], [place] [phone]

## 2016-12-29 ENCOUNTER — Encounter: Payer: Self-pay | Admitting: General Surgery

## 2016-12-29 ENCOUNTER — Other Ambulatory Visit: Payer: Self-pay

## 2016-12-29 ENCOUNTER — Other Ambulatory Visit (HOSPITAL_COMMUNITY): Payer: Self-pay

## 2016-12-29 HISTORY — PX: IR FLUORO GUIDE CV LINE RIGHT: IMG2283

## 2016-12-29 HISTORY — PX: IR US GUIDE VASC ACCESS RIGHT: IMG2390

## 2016-12-29 LAB — CBC
HCT: 30.4 % — ABNORMAL LOW (ref 39.0–52.0)
HEMOGLOBIN: 9.3 g/dL — AB (ref 13.0–17.0)
MCH: 27.4 pg (ref 26.0–34.0)
MCHC: 30.6 g/dL (ref 30.0–36.0)
MCV: 89.7 fL (ref 78.0–100.0)
Platelets: 405 10*3/uL — ABNORMAL HIGH (ref 150–400)
RBC: 3.39 MIL/uL — AB (ref 4.22–5.81)
RDW: 18.8 % — ABNORMAL HIGH (ref 11.5–15.5)
WBC: 12.8 10*3/uL — AB (ref 4.0–10.5)

## 2016-12-29 LAB — RENAL FUNCTION PANEL
ANION GAP: 18 — AB (ref 5–15)
Albumin: 1.7 g/dL — ABNORMAL LOW (ref 3.5–5.0)
BUN: 137 mg/dL — ABNORMAL HIGH (ref 6–20)
CALCIUM: 8.1 mg/dL — AB (ref 8.9–10.3)
CHLORIDE: 109 mmol/L (ref 101–111)
CO2: 12 mmol/L — AB (ref 22–32)
Creatinine, Ser: 4.76 mg/dL — ABNORMAL HIGH (ref 0.61–1.24)
GFR, EST AFRICAN AMERICAN: 12 mL/min — AB (ref >60)
GFR, EST NON AFRICAN AMERICAN: 10 mL/min — AB (ref >60)
Glucose, Bld: 215 mg/dL — ABNORMAL HIGH (ref 65–99)
Phosphorus: 13.2 mg/dL — ABNORMAL HIGH (ref 2.5–4.6)
Potassium: 3.8 mmol/L (ref 3.5–5.1)
Sodium: 139 mmol/L (ref 135–145)

## 2016-12-29 MED ORDER — HEPARIN SODIUM (PORCINE) 1000 UNIT/ML IJ SOLN
INTRAMUSCULAR | Status: AC
  Filled 2016-12-29: qty 1

## 2016-12-29 MED ORDER — CEFAZOLIN SODIUM-DEXTROSE 2-4 GM/100ML-% IV SOLN
INTRAVENOUS | Status: AC
  Administered 2016-12-29: 2000 mg via INTRAVENOUS
  Filled 2016-12-29: qty 100

## 2016-12-29 MED ORDER — FENTANYL CITRATE (PF) 100 MCG/2ML IJ SOLN
INTRAMUSCULAR | Status: AC
  Filled 2016-12-29: qty 6

## 2016-12-29 MED ORDER — MIDAZOLAM HCL 2 MG/2ML IJ SOLN
INTRAMUSCULAR | Status: AC
  Filled 2016-12-29: qty 8

## 2016-12-29 MED ORDER — MIDAZOLAM HCL 2 MG/2ML IJ SOLN
INTRAMUSCULAR | Status: AC | PRN
  Administered 2016-12-29: 1 mg via INTRAVENOUS

## 2016-12-29 MED ORDER — LIDOCAINE HCL 1 % IJ SOLN
INTRAMUSCULAR | Status: AC | PRN
  Administered 2016-12-29: 15 mL

## 2016-12-29 MED ORDER — LIDOCAINE HCL 1 % IJ SOLN
INTRAMUSCULAR | Status: AC
  Filled 2016-12-29: qty 20

## 2016-12-29 MED ORDER — FENTANYL CITRATE (PF) 100 MCG/2ML IJ SOLN
INTRAMUSCULAR | Status: AC | PRN
  Administered 2016-12-29: 50 ug via INTRAVENOUS

## 2016-12-29 MED ORDER — HEPARIN SODIUM (PORCINE) 1000 UNIT/ML IJ SOLN
INTRAMUSCULAR | Status: AC | PRN
  Administered 2016-12-29: 4.2 mL via INTRAVENOUS

## 2016-12-29 MED ORDER — CEFAZOLIN SODIUM-DEXTROSE 2-4 GM/100ML-% IV SOLN
2.0000 g | Freq: Once | INTRAVENOUS | Status: AC
  Administered 2016-12-29: 2000 mg via INTRAVENOUS

## 2016-12-29 NOTE — Sedation Documentation (Signed)
Patient is resting comfortably. 

## 2016-12-29 NOTE — Progress Notes (Signed)
Patient ID: Patrick Gomez, male   DOB: 1933/04/06, 83 y.o.   MRN: [id number]    Referring Physician(s): Dr. Carron Curie  Supervising Physician: Jolaine Click  Patient Status: [facility]  Chief Complaint: ESRD  Subjective: Patient known to the IR service for multiple procedures regarding his GJ tube as well as drain management.  He also has a perm cath in place that is not functioning well for HD.  Nephrology has asked [place] to see him to exchange/replace this catheter.  Patient does not respond to questions.  Allergies: Patient has no allergy information on record.  Medications: Prior to Admission medications   Not on File    Vital Signs: BP (!) 82/53   Pulse (!) 115   Resp (!) 33   SpO2 99%   Physical Exam: Gen: laying in and does not answer questions, but eyes are open. Heart: regular Chest: left catheter in place Abd: soft, multiple drains in place in anterior abdomen with enteric contents draining.  GJ tube in place to gravity.  Psoas drain with tan output with brown flecks present.  Imaging: Ct Head Wo Contrast  Result Date: 12/29/2016 CLINICAL DATA:  Followup stroke. History of retroperitoneal abscess, renal failure. EXAM: CT HEAD WITHOUT CONTRAST TECHNIQUE: Contiguous axial images were obtained from the base of the skull through the vertex without intravenous contrast. COMPARISON:  CT HEAD Dec 15, 2016 FINDINGS: BRAIN: Faint marginal density at bilateral cerebellar infarcts. More confluent marginal density within RIGHT greater than LEFT bilateral frontal, parietal and occipital lobes. No midline shift, mass effect or acute large vascular territory infarcts. Patchy supratentorial white matter hypodensities exclusive a aforementioned abnormalities compatible with mild chronic small vessel ischemic disease. No abnormal extra-axial fluid collections. Basal cisterns are patent. VASCULAR: Moderate calcific atherosclerosis of the carotid siphons. SKULL: No skull fracture. No significant  scalp soft tissue swelling. SINUSES/ORBITS: The mastoid air-cells and included paranasal sinuses are well-aerated.The included ocular globes and orbital contents are non-suspicious. OTHER: None. IMPRESSION: Subacute bilateral cerebellum, bifrontal, biparietal and bioccipital lobe infarcts with marginal density most compatible with petechial hemorrhage and early mineralization. Electronically Signed   By: Awilda Metro M.D.   On: 12/29/2016 00:38   Dg Chest Port 1 View  Result Date: 12/27/2016 CLINICAL DATA:  Respiratory failure EXAM: PORTABLE CHEST 1 VIEW COMPARISON:  Chest x-ray of Dec 14, 2016. FINDINGS: The lungs are adequately inflated. There is no alveolar infiltrate. Parenchymal density persists in the right apex and in the left mid to upper lung. There is no pneumothorax or pneumomediastinum. The heart and pulmonary vascularity are normal. There is calcification in the wall of the aortic arch. The observed bony thorax exhibits no acute abnormality. IMPRESSION: Chronic bronchitic changes, stable. Stable parenchymal densities in the right apex and in the left upper lobe. Thoracic aortic atherosclerosis. Electronically Signed   By: David  [place]az[place]and M.D.   On: 12/27/2016 10:27   Dg Abd Portable 1v  Result Date: 12/26/2016 CLINICAL DATA:  Abdominal distension.  Postop duodenal ulcer repair. EXAM: PORTABLE ABDOMEN - 1 VIEW COMPARISON:  None. FINDINGS: There are 2 multipurpose catheter is projecting over the right and left mid abdomen. There is no bowel dilatation to suggest obstruction. There is no evidence of pneumoperitoneum, portal venous gas or pneumatosis. There are no pathologic calcifications along the expected course of the ureters. There is a right hip arthroplasty. There is a dextrocurvature of the thoracolumbar spine. There is lumbar spine spondylosis. IMPRESSION: No acute abnormality. Two drainage catheters in unchanged position in the retroperitoneum.  Electronically Signed   By: Elige KoHetal  Patel    On: 12/26/2016 08:24    Labs:  CBC:  Recent Labs  12/23/16 0730 12/26/16 0754 12/28/16 0705 12/29/16 0650  WBC 21.5* 17.5* 12.8* 12.8*  HGB 8.2* 9.2* 9.2* 9.3*  HCT 26.0* 28.9* 29.9* 30.4*  PLT 286 403* 424* 405*    COAGS:  Recent Labs  12/13/16 1007  INR 1.45  APTT 28    BMP:  Recent Labs  12/23/16 0500 12/26/16 0754 12/28/16 0704 12/29/16 0651  NA 135 139 139 139  K 3.4* 4.2 4.0 3.8  CL 106 108 110 109  CO2 17* 13* 13* 12*  GLUCOSE 187* 234* 262* 215*  BUN 68* 101* 130* 137*  CALCIUM 8.0* 8.4* 8.2* 8.1*  CREATININE 4.34* 4.48* 4.60* 4.76*  GFRNONAA 12* 11* 11* 10*  GFRAA 13* 13* 12* 12*    LIVER FUNCTION TESTS:  Recent Labs  12/15/16 0509  12/23/16 0500 12/26/16 0754 12/28/16 0704 12/29/16 0651  BILITOT 0.7  --   --   --   --   --   AST 22  --   --   --   --   --   ALT 16*  --   --   --   --   --   ALKPHOS 83  --   --   --   --   --   PROT 6.4*  --   --   --   --   --   ALBUMIN 1.9*  1.9*  < > 1.6* 1.6* 1.6* 1.7*  < > = values in this interval not displayed.  Assessment and Plan: 1. Nonfunctioning perm cath. We will plan to exchange/replace this catheter today so he can continue with HD.  Wife has given consent for this procedure.  He is NPO and blood thinners have been held.  2.  Multiple abdominal drains Cont all drains.  Anterior abdominal drains with enteric contents draining.  Electronically Signed: Letha CapeSBORNE,Austan Nicholl E 12/29/2016, 11:52 AM   I spent a total of 15 Minutes at the the patient's bedside AND on the patient's hospital floor or unit, greater than 50% of which was counseling/coordinating care for ESRD, needs new catheter, abdominal fluid collections

## 2016-12-29 NOTE — Procedures (Signed)
RIJV HD catheter SVC RA EBL 0 Comp 0

## 2016-12-29 NOTE — Sedation Documentation (Signed)
IV fluid bolus started to help raise BP.

## 2016-12-30 NOTE — Progress Notes (Signed)
Referring Physician(s):  Dr. Sharyon Medicus  Supervising Physician: Simonne Come  Patient Status:  [facility] - In-pt  Chief Complaint:  Retroperitoneal abscess  Subjective: Lethargic today.  HD catheter in place- currently on dialysis in room at time of visit.  Patient with right JP, duodenostomy, G-tube, surgical J-tube replaced in IR 5/17. Retroperitoneal abscess drains placed 5/19. Left drain replaced 5/23.  Allergies: Patient has no allergy information on record.  Medications: Prior to Admission medications   Not on File     Vital Signs: BP 102/60   Pulse 97   Resp (!) 24   SpO2 100%   Physical Exam  Constitutional: He appears well-developed.  Abdominal:  Retroperitoneal drains in place bilaterally.  Left drain with tan, purulent output.  Right drain with tan output, output decreasing.   Neurological:  lethargic  Nursing note and vitals reviewed.   Imaging: Ct Head Wo Contrast  Result Date: 12/29/2016 CLINICAL DATA:  Followup stroke. History of retroperitoneal abscess, renal failure. EXAM: CT HEAD WITHOUT CONTRAST TECHNIQUE: Contiguous axial images were obtained from the base of the skull through the vertex without intravenous contrast. COMPARISON:  CT HEAD Dec 15, 2016 FINDINGS: BRAIN: Faint marginal density at bilateral cerebellar infarcts. More confluent marginal density within RIGHT greater than LEFT bilateral frontal, parietal and occipital lobes. No midline shift, mass effect or acute large vascular territory infarcts. Patchy supratentorial white matter hypodensities exclusive a aforementioned abnormalities compatible with mild chronic small vessel ischemic disease. No abnormal extra-axial fluid collections. Basal cisterns are patent. VASCULAR: Moderate calcific atherosclerosis of the carotid siphons. SKULL: No skull fracture. No significant scalp soft tissue swelling. SINUSES/ORBITS: The mastoid air-cells and included paranasal sinuses are well-aerated.The included ocular  globes and orbital contents are non-suspicious. OTHER: None. IMPRESSION: Subacute bilateral cerebellum, bifrontal, biparietal and bioccipital lobe infarcts with marginal density most compatible with petechial hemorrhage and early mineralization. Electronically Signed   By: Awilda Metro M.D.   On: 12/29/2016 00:38   Ir Fluoro Guide Cv Line Right  Result Date: 12/29/2016 INDICATION: Renal failure EXAM: TUNNELED DIALYSIS CATHETER PLACEMENT, ULTRASOUND GUIDANCE FOR VASCULAR ACCESS MEDICATIONS: Ancef; The antibiotic was administered within an appropriate time interval prior to skin puncture. ANESTHESIA/SEDATION: Versed 1 mg IV; Fentanyl 50 mcg IV; Moderate Sedation Time:  15 The patient was continuously monitored during the procedure by the interventional radiology nurse under my direct supervision. FLUOROSCOPY TIME:  Fluoroscopy Time: 2 minutes 42 seconds (21 mGy). COMPLICATIONS: None immediate. PROCEDURE: Informed written consent was obtained from the patient after a thorough discussion of the procedural risks, benefits and alternatives. All questions were addressed. Maximal Sterile Barrier Technique was utilized including caps, mask, sterile gowns, sterile gloves, sterile drape, hand hygiene and skin antiseptic. A timeout was performed prior to the initiation of the procedure. The right neck was prepped with ChloraPrep in a sterile fashion, and a sterile drape was applied covering the operative field. A sterile gown and sterile gloves were used for the procedure. 1% lidocaine into the skin and subcutaneous tissue. The right jugular vein was noted to be patent initially with ultrasound. Under sonographic guidance, a micropuncture needle was inserted into the right IJ vein (Ultrasound and fluoroscopic image documentation was performed). It was removed over an 018 wire which was up-sized to an Amplatz. This was advanced into the IVC. A small incision was made in the right upper chest. The tunneling device was  utilized to advance the 28 centimeter tip to cuff catheter from the chest incision and out the neck  incision. A peel-away sheath was advanced over the Amplatz wire. The leading edge of the catheter was then advanced through the peel-away sheath. The peel-away sheath was removed. It was flushed and instilled with heparin. The chest incision was closed with a 0 Prolene pursestring stitch. The neck incision was closed with a 4-0 Vicryl subcuticular stitch. IMPRESSION: Successful right IJ vein tunneled dialysis catheter with its tip in the right atrium. Electronically Signed   By: Jolaine ClickArthur  Hoss M.D.   On: 12/29/2016 15:31   Ir [place] Guide Vasc Access Right  Result Date: 12/29/2016 INDICATION: Renal failure EXAM: TUNNELED DIALYSIS CATHETER PLACEMENT, ULTRASOUND GUIDANCE FOR VASCULAR ACCESS MEDICATIONS: Ancef; The antibiotic was administered within an appropriate time interval prior to skin puncture. ANESTHESIA/SEDATION: Versed 1 mg IV; Fentanyl 50 mcg IV; Moderate Sedation Time:  15 The patient was continuously monitored during the procedure by the interventional radiology nurse under my direct supervision. FLUOROSCOPY TIME:  Fluoroscopy Time: 2 minutes 42 seconds (21 mGy). COMPLICATIONS: None immediate. PROCEDURE: Informed written consent was obtained from the patient after a thorough discussion of the procedural risks, benefits and alternatives. All questions were addressed. Maximal Sterile Barrier Technique was utilized including caps, mask, sterile gowns, sterile gloves, sterile drape, hand hygiene and skin antiseptic. A timeout was performed prior to the initiation of the procedure. The right neck was prepped with ChloraPrep in a sterile fashion, and a sterile drape was applied covering the operative field. A sterile gown and sterile gloves were used for the procedure. 1% lidocaine into the skin and subcutaneous tissue. The right jugular vein was noted to be patent initially with ultrasound. Under sonographic  guidance, a micropuncture needle was inserted into the right IJ vein (Ultrasound and fluoroscopic image documentation was performed). It was removed over an 018 wire which was up-sized to an Amplatz. This was advanced into the IVC. A small incision was made in the right upper chest. The tunneling device was utilized to advance the 28 centimeter tip to cuff catheter from the chest incision and out the neck incision. A peel-away sheath was advanced over the Amplatz wire. The leading edge of the catheter was then advanced through the peel-away sheath. The peel-away sheath was removed. It was flushed and instilled with heparin. The chest incision was closed with a 0 Prolene pursestring stitch. The neck incision was closed with a 4-0 Vicryl subcuticular stitch. IMPRESSION: Successful right IJ vein tunneled dialysis catheter with its tip in the right atrium. Electronically Signed   By: Jolaine ClickArthur  Hoss M.D.   On: 12/29/2016 15:31   Dg Chest Port 1 View  Result Date: 12/27/2016 CLINICAL DATA:  Respiratory failure EXAM: PORTABLE CHEST 1 VIEW COMPARISON:  Chest x-ray of Dec 14, 2016. FINDINGS: The lungs are adequately inflated. There is no alveolar infiltrate. Parenchymal density persists in the right apex and in the left mid to upper lung. There is no pneumothorax or pneumomediastinum. The heart and pulmonary vascularity are normal. There is calcification in the wall of the aortic arch. The observed bony thorax exhibits no acute abnormality. IMPRESSION: Chronic bronchitic changes, stable. Stable parenchymal densities in the right apex and in the left upper lobe. Thoracic aortic atherosclerosis. Electronically Signed   By: David  SwazilandJordan M.D.   On: 12/27/2016 10:27    Labs:  CBC:  Recent Labs  12/23/16 0730 12/26/16 0754 12/28/16 0705 12/29/16 0650  WBC 21.5* 17.5* 12.8* 12.8*  HGB 8.2* 9.2* 9.2* 9.3*  HCT 26.0* 28.9* 29.9* 30.4*  PLT 286 403* 424* 405*  COAGS:  Recent Labs  12/13/16 1007  INR 1.45    APTT 28    BMP:  Recent Labs  12/23/16 0500 12/26/16 0754 12/28/16 0704 12/29/16 0651  NA 135 139 139 139  K 3.4* 4.2 4.0 3.8  CL 106 108 110 109  CO2 17* 13* 13* 12*  GLUCOSE 187* 234* 262* 215*  BUN 68* 101* 130* 137*  CALCIUM 8.0* 8.4* 8.2* 8.1*  CREATININE 4.34* 4.48* 4.60* 4.76*  GFRNONAA 12* 11* 11* 10*  GFRAA 13* 13* 12* 12*    LIVER FUNCTION TESTS:  Recent Labs  12/15/16 0509  12/23/16 0500 12/26/16 0754 12/28/16 0704 12/29/16 0651  BILITOT 0.7  --   --   --   --   --   AST 22  --   --   --   --   --   ALT 16*  --   --   --   --   --   ALKPHOS 83  --   --   --   --   --   PROT 6.4*  --   --   --   --   --   ALBUMIN 1.9*  1.9*  < > 1.6* 1.6* 1.6* 1.7*  < > = values in this interval not displayed.  Assessment and Plan: Retroperitoneal abscess Patient with multiple drains placed prior to admission.  IR has replaced J-tube as well as HD catheter- last replaced yesterday afternoon.  Patient also now s/p bilateral retroperitoneal catheter placement 5/19.  Left drain was replaced due to broken tubing on 5/23. Drains remain in place.  Both retroperitoneal drains intact with continued output, left > right, but decreasing output. Continue drain care. IR to follow  Electronically Signed: Hoyt Koch, PA 12/30/2016, 12:06 PM   I spent a total of 15 Minutes at the the patient's bedside AND on the patient's hospital floor or unit, greater than 50% of which was counseling/coordinating care for retroperitoneal abscess.

## 2016-12-30 NOTE — Progress Notes (Signed)
[facility] [place], [place] 12/30/16  Subjective:  Patient now has R IJ permcath in place.  Pt completed HD today. He will be scheduled for dialysis again tomorrow and Monday as well.     Objective:  Vital signs in last 24 hours:  Temperature 97.3 pulse 93 respirations 28 blood pressure 127/97   Physical Exam: General: NAD, laying in bed  HEENT anicteric , mouth dry  Neck supple  Pulm/lungs Coarse b/l, normal effort  CVS/Heart S1S2 no rubs  Abdomen:  Multiple abdominal drains in place, mild distension  Extremities: + heels in soft support, trace edema  Neurologic: Arousable, not consistently following commands  Skin: Scattered ecchymoses  Access: R IJ permcath       Basic Metabolic Panel:   Recent Labs Lab 12/26/16 0754 12/28/16 0704 12/29/16 0651  NA 139 139 139  K 4.2 4.0 3.8  CL 108 110 109  CO2 13* 13* 12*  GLUCOSE 234* 262* 215*  BUN 101* 130* 137*  CREATININE 4.48* 4.60* 4.76*  CALCIUM 8.4* 8.2* 8.1*  PHOS 11.1* 12.9* 13.2*     CBC:  Recent Labs Lab 12/26/16 0754 12/28/16 0705 12/29/16 0650  WBC 17.5* 12.8* 12.8*  HGB 9.2* 9.2* 9.3*  HCT 28.9* 29.9* 30.4*  MCV 88.1 89.3 89.7  PLT 403* 424* 405*      Lab Results  Component Value Date   HEPBSAG Negative 12/15/2016      Microbiology:  No results found for this or any previous visit (from the past 240 hour(s)).  Coagulation Studies: No results for input(s): LABPROT, INR in the last 72 hours.  Urinalysis: No results for input(s): COLORURINE, LABSPEC, PHURINE, GLUCOSEU, HGBUR, BILIRUBINUR, KETONESUR, PROTEINUR, UROBILINOGEN, NITRITE, LEUKOCYTESUR in the last 72 hours.  Invalid input(s): APPERANCEUR    Imaging: Ct Head Wo Contrast  Result Date: 12/29/2016 CLINICAL DATA:  Followup stroke. History of retroperitoneal abscess, renal failure. EXAM: CT HEAD WITHOUT CONTRAST TECHNIQUE: Contiguous axial images were obtained from the base of the skull through the vertex  without intravenous contrast. COMPARISON:  CT HEAD Dec 15, 2016 FINDINGS: BRAIN: Faint marginal density at bilateral cerebellar infarcts. More confluent marginal density within RIGHT greater than LEFT bilateral frontal, parietal and occipital lobes. No midline shift, mass effect or acute large vascular territory infarcts. Patchy supratentorial white matter hypodensities exclusive a aforementioned abnormalities compatible with mild chronic small vessel ischemic disease. No abnormal extra-axial fluid collections. Basal cisterns are patent. VASCULAR: Moderate calcific atherosclerosis of the carotid siphons. SKULL: No skull fracture. No significant scalp soft tissue swelling. SINUSES/ORBITS: The mastoid air-cells and included paranasal sinuses are well-aerated.The included ocular globes and orbital contents are non-suspicious. OTHER: None. IMPRESSION: Subacute bilateral cerebellum, bifrontal, biparietal and bioccipital lobe infarcts with marginal density most compatible with petechial hemorrhage and early mineralization. Electronically Signed   By: Awilda Metro M.D.   On: 12/29/2016 00:38   Ir Fluoro Guide Cv Line Right  Result Date: 12/29/2016 INDICATION: Renal failure EXAM: TUNNELED DIALYSIS CATHETER PLACEMENT, ULTRASOUND GUIDANCE FOR VASCULAR ACCESS MEDICATIONS: Ancef; The antibiotic was administered within an appropriate time interval prior to skin puncture. ANESTHESIA/SEDATION: Versed 1 mg IV; Fentanyl 50 mcg IV; Moderate Sedation Time:  15 The patient was continuously monitored during the procedure by the interventional radiology nurse under my direct supervision. FLUOROSCOPY TIME:  Fluoroscopy Time: 2 minutes 42 seconds (21 mGy). COMPLICATIONS: None immediate. PROCEDURE: Informed written consent was obtained from the patient after a thorough discussion of the procedural risks, benefits and alternatives. All questions were addressed. Maximal  Sterile Barrier Technique was utilized including caps, mask,  sterile gowns, sterile gloves, sterile drape, hand hygiene and skin antiseptic. A timeout was performed prior to the initiation of the procedure. The right neck was prepped with ChloraPrep in a sterile fashion, and a sterile drape was applied covering the operative field. A sterile gown and sterile gloves were used for the procedure. 1% lidocaine into the skin and subcutaneous tissue. The right jugular vein was noted to be patent initially with ultrasound. Under sonographic guidance, a micropuncture needle was inserted into the right IJ vein (Ultrasound and fluoroscopic image documentation was performed). It was removed over an 018 wire which was up-sized to an Amplatz. This was advanced into the IVC. A small incision was made in the right upper chest. The tunneling device was utilized to advance the 28 centimeter tip to cuff catheter from the chest incision and out the neck incision. A peel-away sheath was advanced over the Amplatz wire. The leading edge of the catheter was then advanced through the peel-away sheath. The peel-away sheath was removed. It was flushed and instilled with heparin. The chest incision was closed with a 0 Prolene pursestring stitch. The neck incision was closed with a 4-0 Vicryl subcuticular stitch. IMPRESSION: Successful right IJ vein tunneled dialysis catheter with its tip in the right atrium. Electronically Signed   By: Jolaine ClickArthur  Hoss M.D.   On: 12/29/2016 15:31   Ir [place] Guide Vasc Access Right  Result Date: 12/29/2016 INDICATION: Renal failure EXAM: TUNNELED DIALYSIS CATHETER PLACEMENT, ULTRASOUND GUIDANCE FOR VASCULAR ACCESS MEDICATIONS: Ancef; The antibiotic was administered within an appropriate time interval prior to skin puncture. ANESTHESIA/SEDATION: Versed 1 mg IV; Fentanyl 50 mcg IV; Moderate Sedation Time:  15 The patient was continuously monitored during the procedure by the interventional radiology nurse under my direct supervision. FLUOROSCOPY TIME:  Fluoroscopy Time: 2  minutes 42 seconds (21 mGy). COMPLICATIONS: None immediate. PROCEDURE: Informed written consent was obtained from the patient after a thorough discussion of the procedural risks, benefits and alternatives. All questions were addressed. Maximal Sterile Barrier Technique was utilized including caps, mask, sterile gowns, sterile gloves, sterile drape, hand hygiene and skin antiseptic. A timeout was performed prior to the initiation of the procedure. The right neck was prepped with ChloraPrep in a sterile fashion, and a sterile drape was applied covering the operative field. A sterile gown and sterile gloves were used for the procedure. 1% lidocaine into the skin and subcutaneous tissue. The right jugular vein was noted to be patent initially with ultrasound. Under sonographic guidance, a micropuncture needle was inserted into the right IJ vein (Ultrasound and fluoroscopic image documentation was performed). It was removed over an 018 wire which was up-sized to an Amplatz. This was advanced into the IVC. A small incision was made in the right upper chest. The tunneling device was utilized to advance the 28 centimeter tip to cuff catheter from the chest incision and out the neck incision. A peel-away sheath was advanced over the Amplatz wire. The leading edge of the catheter was then advanced through the peel-away sheath. The peel-away sheath was removed. It was flushed and instilled with heparin. The chest incision was closed with a 0 Prolene pursestring stitch. The neck incision was closed with a 4-0 Vicryl subcuticular stitch. IMPRESSION: Successful right IJ vein tunneled dialysis catheter with its tip in the right atrium. Electronically Signed   By: Jolaine ClickArthur  Hoss M.D.   On: 12/29/2016 15:31     Medications:  Assessment/ Plan:  83 y.o.caucasian male with history of coronary disease, left circumflex stent in 2007, COPD, diabetes type 2 insulin-dependent, hypertension, hyperlipidemia, BPH, peptic ulcer  disease, right hip surgery, cholecystectomy, hernia repair, was admitted on 12/09/2016 for ongoing postsurgical care. Patient underwent ex - laparotomy for perforated duodenal ulcer 4/21 and repair of leak on 4/23. Hospital course complicated by acute respiratory failure requiring intubation, now extubated,  1. ARF, oliguric.  R IJ permcath placed 12/29/16. -Reliable access now in place, we will plan for HD tomorrow and then again on Monday.    2.  Acidosis Repeat serum bicarbonate tomorrow prior to dialysis, should improve with HD.   3. Hyperkalemia -  K down to 3.8 and acceptable.   4. Altered mental status - BUN currently 137 and Cr 4.76, these should come down over the weekend with HD.   5. Hyperphosphatemia - phos was 13.2 yesterday, dialysis today will be 2.5 hours, phos should be reduced tomorrow.   6. Status post exploratory laparotomy for perforated duodenal ulcer   LOS: 0 Yassmin Binegar 6/1/20184:58 PM  [facility] [place], [place] [phone]

## 2016-12-31 LAB — RENAL FUNCTION PANEL
Albumin: 1.7 g/dL — ABNORMAL LOW (ref 3.5–5.0)
Anion gap: 13 (ref 5–15)
BUN: 76 mg/dL — ABNORMAL HIGH (ref 6–20)
CHLORIDE: 103 mmol/L (ref 101–111)
CO2: 19 mmol/L — ABNORMAL LOW (ref 22–32)
CREATININE: 3.73 mg/dL — AB (ref 0.61–1.24)
Calcium: 7.7 mg/dL — ABNORMAL LOW (ref 8.9–10.3)
GFR calc Af Amer: 16 mL/min — ABNORMAL LOW (ref >60)
GFR, EST NON AFRICAN AMERICAN: 14 mL/min — AB (ref >60)
Glucose, Bld: 211 mg/dL — ABNORMAL HIGH (ref 65–99)
POTASSIUM: 3.1 mmol/L — AB (ref 3.5–5.1)
Phosphorus: 9.3 mg/dL — ABNORMAL HIGH (ref 2.5–4.6)
Sodium: 135 mmol/L (ref 135–145)

## 2016-12-31 LAB — CBC
HEMATOCRIT: 30 % — AB (ref 39.0–52.0)
Hemoglobin: 8.9 g/dL — ABNORMAL LOW (ref 13.0–17.0)
MCH: 27 pg (ref 26.0–34.0)
MCHC: 29.7 g/dL — ABNORMAL LOW (ref 30.0–36.0)
MCV: 90.9 fL (ref 78.0–100.0)
PLATELETS: 296 10*3/uL (ref 150–400)
RBC: 3.3 MIL/uL — AB (ref 4.22–5.81)
RDW: 19.4 % — ABNORMAL HIGH (ref 11.5–15.5)
WBC: 11.2 10*3/uL — AB (ref 4.0–10.5)

## 2017-01-02 LAB — CBC
HEMATOCRIT: 32.9 % — AB (ref 39.0–52.0)
Hemoglobin: 9.8 g/dL — ABNORMAL LOW (ref 13.0–17.0)
MCH: 27.5 pg (ref 26.0–34.0)
MCHC: 29.8 g/dL — ABNORMAL LOW (ref 30.0–36.0)
MCV: 92.4 fL (ref 78.0–100.0)
Platelets: 305 10*3/uL (ref 150–400)
RBC: 3.56 MIL/uL — ABNORMAL LOW (ref 4.22–5.81)
RDW: 19.7 % — AB (ref 11.5–15.5)
WBC: 11.7 10*3/uL — ABNORMAL HIGH (ref 4.0–10.5)

## 2017-01-02 LAB — RENAL FUNCTION PANEL
ALBUMIN: 1.6 g/dL — AB (ref 3.5–5.0)
Albumin: 2 g/dL — ABNORMAL LOW (ref 3.5–5.0)
Anion gap: 14 (ref 5–15)
Anion gap: 10 (ref 5–15)
BUN: 71 mg/dL — ABNORMAL HIGH (ref 6–20)
BUN: 40 mg/dL — ABNORMAL HIGH (ref 6–20)
CALCIUM: 7.7 mg/dL — AB (ref 8.9–10.3)
CHLORIDE: 102 mmol/L (ref 101–111)
CO2: 17 mmol/L — ABNORMAL LOW (ref 22–32)
CO2: 25 mmol/L (ref 22–32)
Calcium: 8 mg/dL — ABNORMAL LOW (ref 8.9–10.3)
Chloride: 102 mmol/L (ref 101–111)
Creatinine, Ser: 3.91 mg/dL — ABNORMAL HIGH (ref 0.61–1.24)
Creatinine, Ser: 4.5 mg/dL — ABNORMAL HIGH (ref 0.61–1.24)
GFR calc non Af Amer: 11 mL/min — ABNORMAL LOW (ref >60)
GFR, EST AFRICAN AMERICAN: 15 mL/min — AB (ref >60)
GFR, EST AFRICAN AMERICAN: 13 mL/min — AB (ref >60)
GFR, EST NON AFRICAN AMERICAN: 13 mL/min — AB (ref >60)
Glucose, Bld: 253 mg/dL — ABNORMAL HIGH (ref 65–99)
Glucose, Bld: 77 mg/dL (ref 65–99)
PHOSPHORUS: 3.6 mg/dL (ref 2.5–4.6)
POTASSIUM: 4.1 mmol/L (ref 3.5–5.1)
Phosphorus: 11.9 mg/dL — ABNORMAL HIGH (ref 2.5–4.6)
Potassium: 3.5 mmol/L (ref 3.5–5.1)
SODIUM: 137 mmol/L (ref 135–145)
Sodium: 133 mmol/L — ABNORMAL LOW (ref 135–145)

## 2017-01-29 DEATH — deceased

## 2018-11-24 IMAGING — DX DG ABDOMEN 1V
1 series · 1 of 1 positions shown · non-contrast
Comparison: None.

CLINICAL DATA: Post percutaneous gastrostomy tube placement.

EXAM:
ABDOMEN - 1 VIEW. 50 mL Hsovue-XVV given through the gastrostomy
tube followed by 30 mL of water.

[abdomen kub]
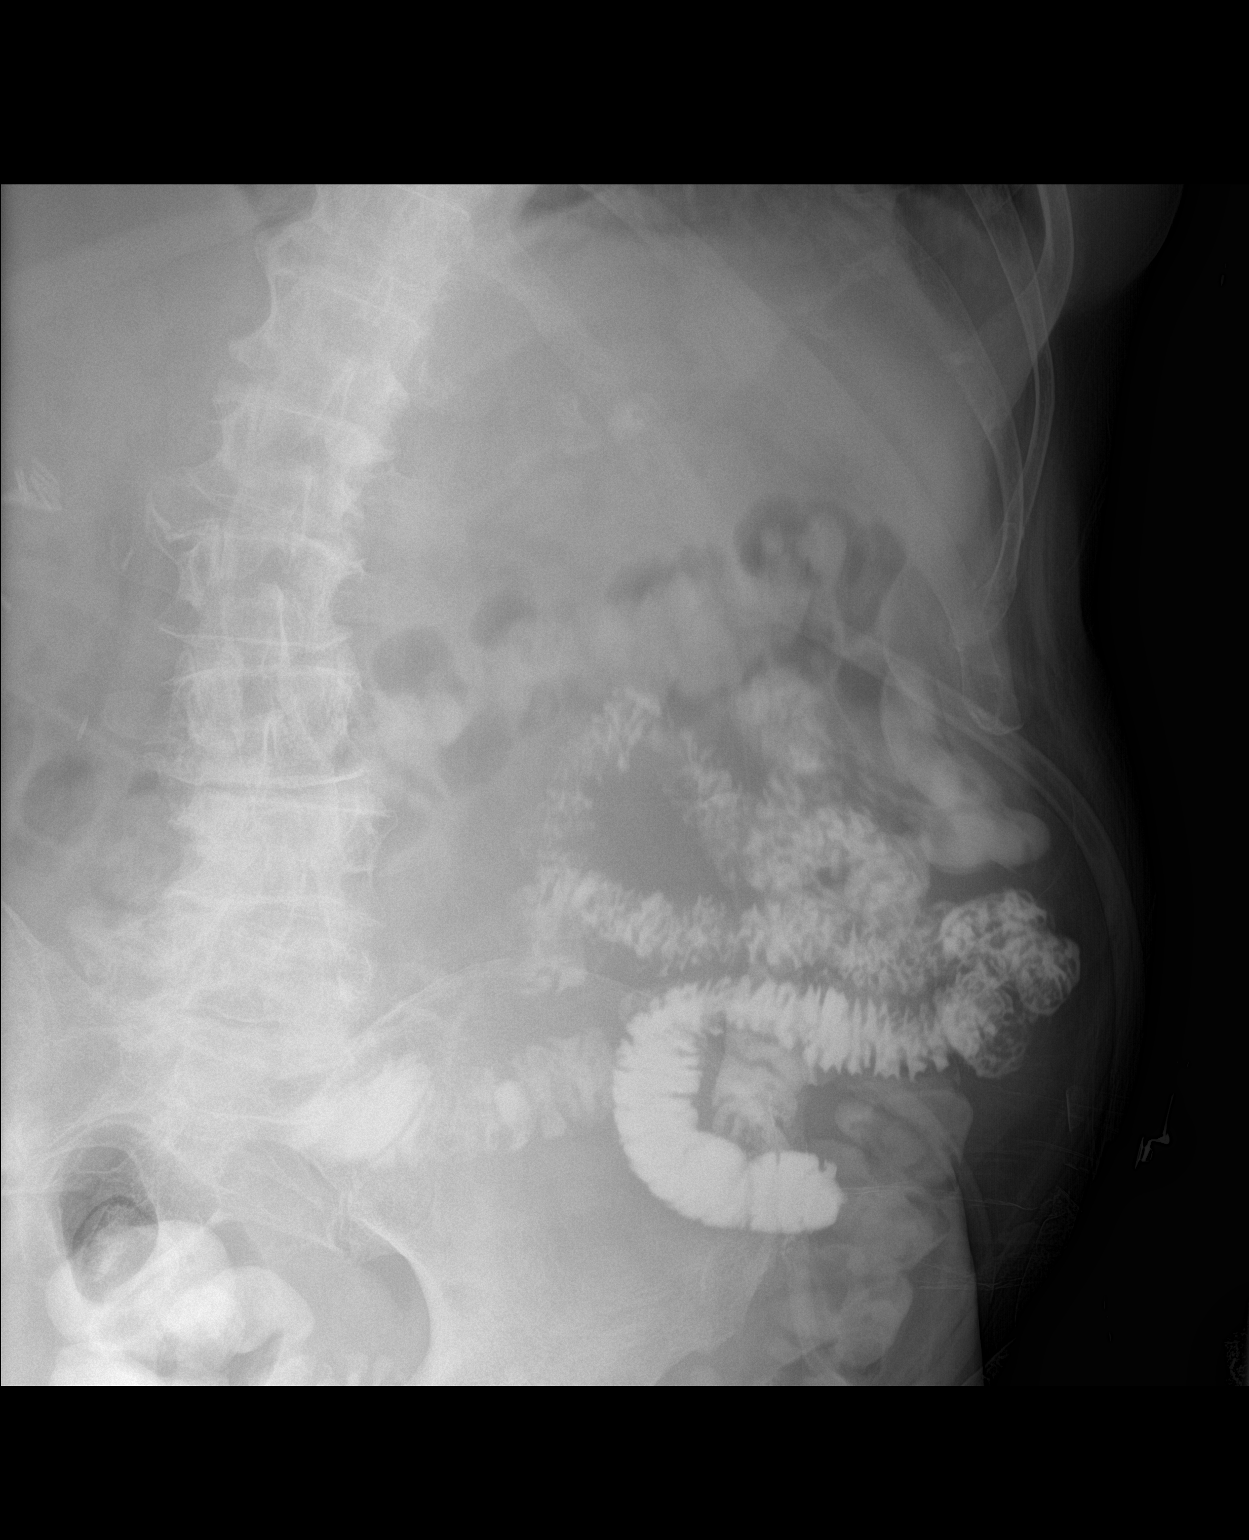

[1 of 1 positions shown; findings below may reference images not displayed]

FINDINGS: Examination demonstrates evidence of patient's percutaneous
gastrostomy tube with tip over the left upper quadrant. Contrast is
present within the small bowel and colon. No evidence of contrast
extravasation. Bowel gas pattern is nonobstructive. There are
moderate degenerative changes of the spine.
IMPRESSION: Nonobstructive bowel gas pattern.

Gastrostomy tube with tip over the left upper quadrant likely over
the stomach with contrast present throughout the small bowel and
colon.

## 2018-11-30 IMAGING — XA IR FLUORO GUIDE CV LINE*L*
3 series · 4 of 4 positions shown · non-contrast
Comparison: none

CLINICAL DATA: Renal failure and hyperkalemia. Need for non
tunneled hemodialysis catheter. Damaged indwelling percutaneous
jejunostomy catheter requires replacement.

[Series 3: fl (-) angio · 1 of 1 slices shown (1 of 3)]
[im 1/1]
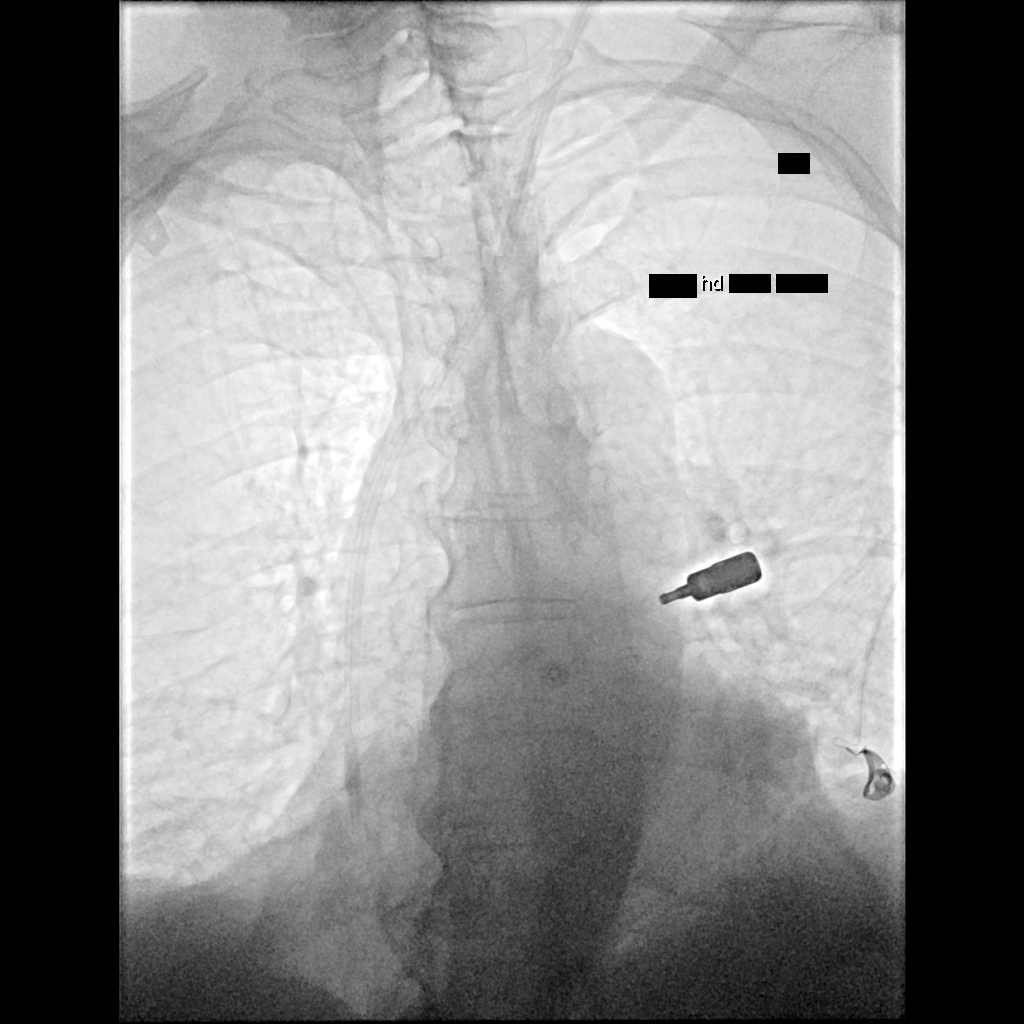

[Series 4: fl (-) angio · 2 of 2 slices shown (2 of 3)]
[im 1/2]
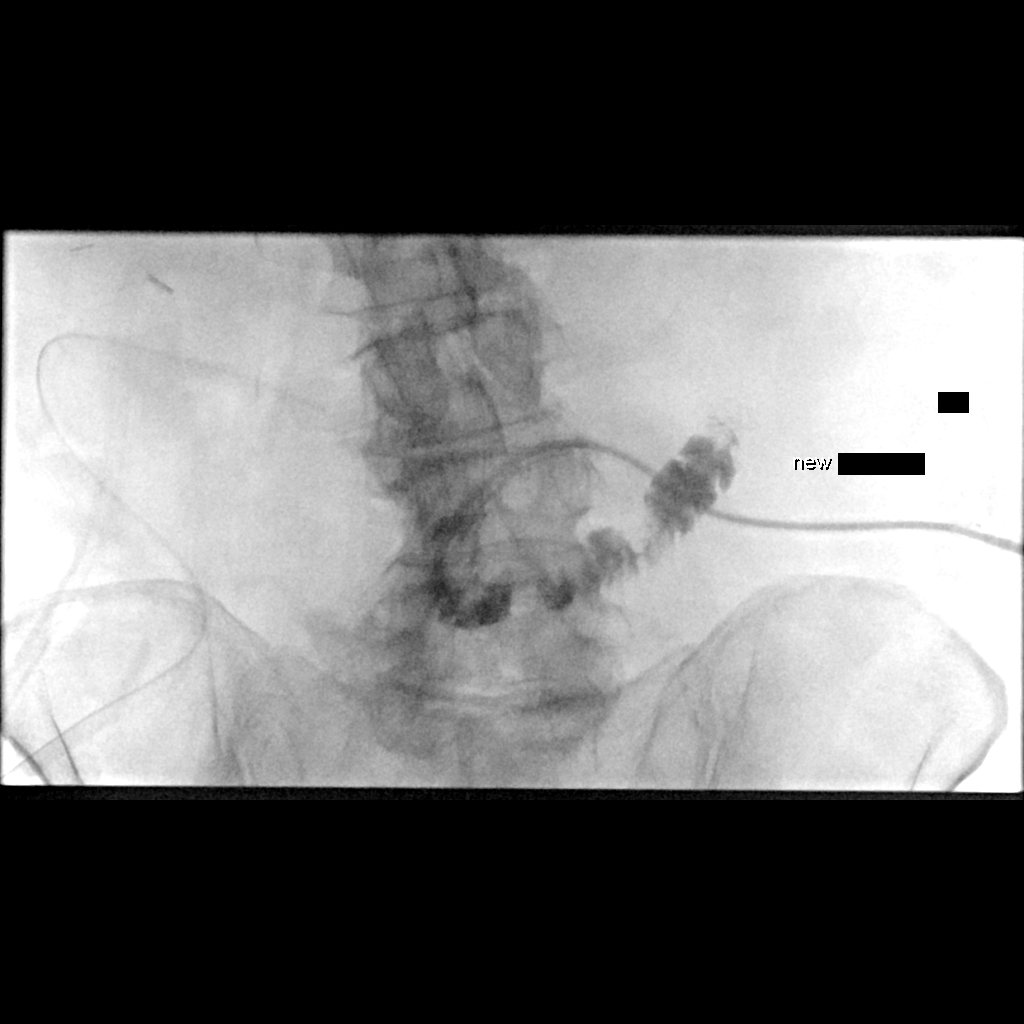
[im 2/2]
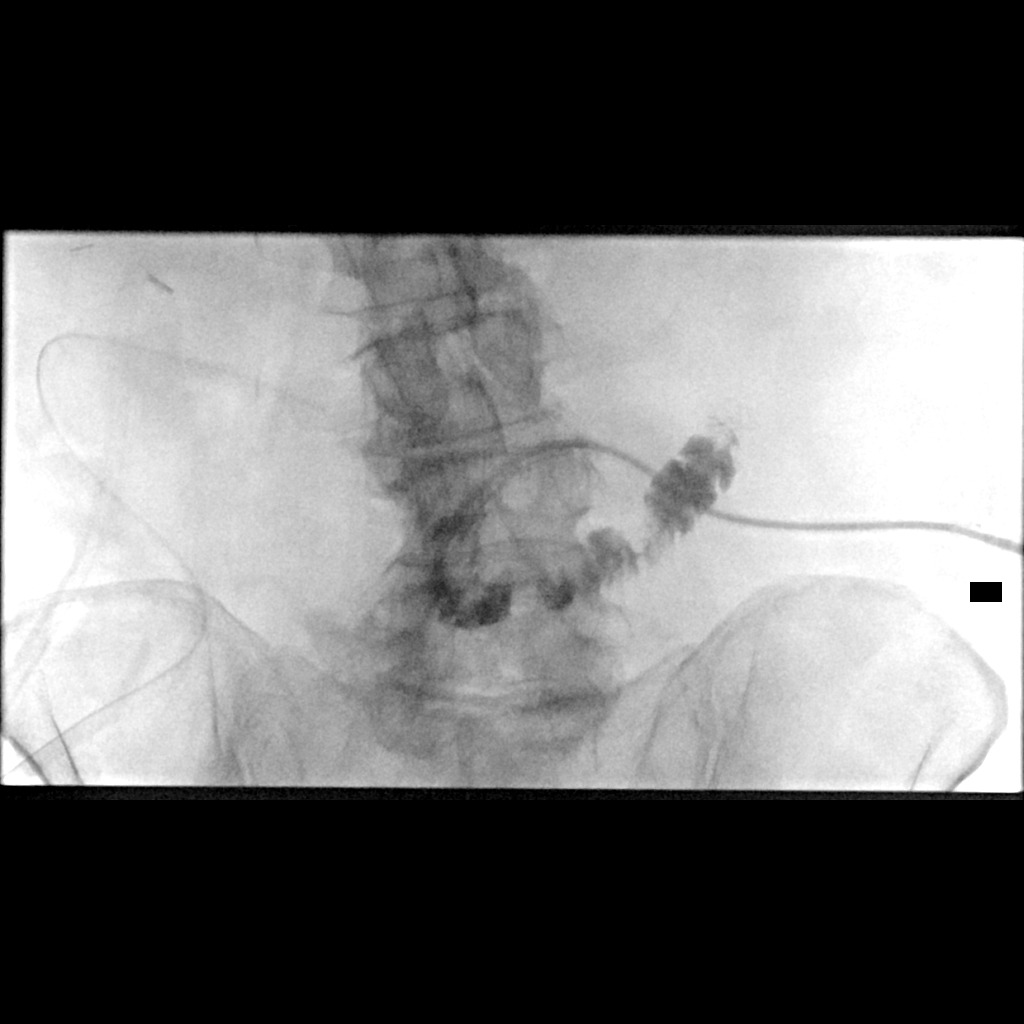

[Series 5: fl (-) angio · 1 of 1 slices shown (3 of 3)]
[im 1/1]
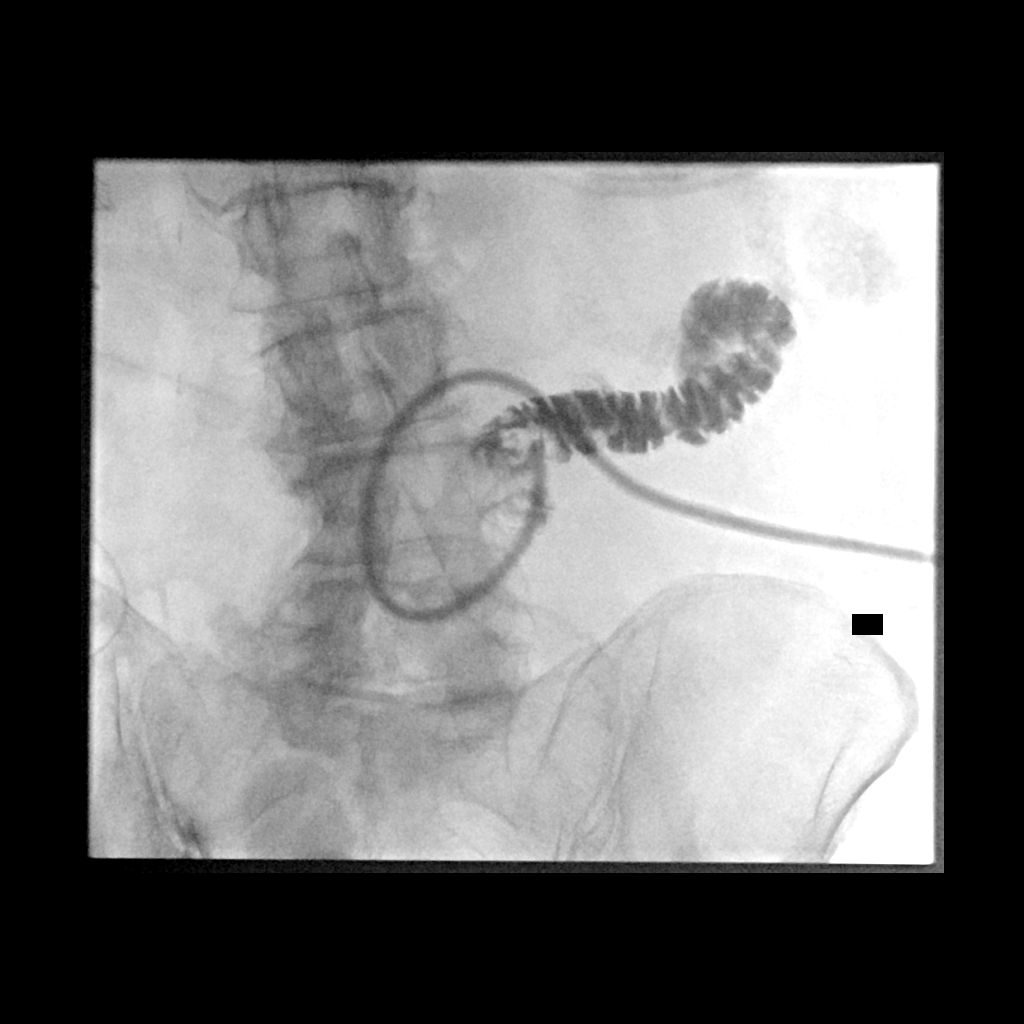

[4 of 4 positions shown; findings below may reference images not displayed]

EXAM:
1. NON-TUNNELED CENTRAL VENOUS CATHETER PLACEMENT WITH ULTRASOUND
AND FLUOROSCOPIC GUIDANCE
2. REPLACEMENT OF PERCUTANEOUS JEJUNOSTOMY CATHETER

FLUOROSCOPY TIME:  1 minutes and 24 seconds.  10.0 mGy.

PROCEDURE:
The procedure, risks, benefits, and alternatives were explained to
the patient's wife. Questions regarding the procedure were
encouraged and answered. The patient's wife understands and consents
to the procedure. A time-out was performed prior to initiating the
procedure.

The left neck and chest were prepped with chlorhexidine in a sterile
fashion, and a sterile drape was applied covering the operative
field. Maximum barrier sterile technique with sterile gowns and
gloves were used for the procedure. Local anesthesia was provided
with 1% lidocaine.

After creating a small venotomy incision, a 21 gauge needle was
advanced into the left internal jugular vein under direct, real-time
ultrasound guidance. Ultrasound image documentation was performed.
Jugular venous access was dilated over a guidewire. A 24 French non
tunneled 13 French hemodialysis catheter was then advanced over the
wire.

Final catheter positioning was confirmed and documented with a
fluoroscopic spot image. The catheter was aspirated, flushed with
saline, and injected with appropriate volume heparin dwells. The
catheter exit site was secured with 0-Prolene retention sutures.

A pre-existing jejunostomy catheter was injected with contrast under
fluoroscopy. The catheter was removed over a guidewire. A new 16
French red rubber jejunostomy catheter was then placed over the
guidewire. Catheter positioning was confirmed by fluoroscopy after
contrast injection and a fluoroscopic spot image saved. The catheter
was secured at its exit site with a Prolene retention suture.

COMPLICATIONS:
None.  No pneumothorax.
FINDINGS: There is a pre-existing central line in the right internal jugular
vein. After left jugular temporary hemodialysis catheter placement,
the tip lies at the cavoatrial junction. The catheter aspirates
normally and is ready for immediate use.

The percutaneous jejunostomy was replaced with a new 16 French
catheter. The catheter tip lies in the jejunum.
IMPRESSION: 1. Placement of non-tunneled central venous hemodialysis catheter
via the left internal jugular vein. The catheter tip lies at the
cavoatrial junction. The catheter is ready for immediate use.
2. Replacement of damaged percutaneous jejunostomy feeding catheter.
A new 16 French red rubber catheter was advanced over a guidewire.
The tip of this catheter lies in the lumen of the jejunum.

## 2018-12-14 IMAGING — XA IR FLUORO GUIDE CV LINE*R*
1 series · 1 of 1 positions shown · non-contrast
Comparison: none

INDICATION: Renal failure

[Series 1: fl (-) angio · 1 of 1 slices shown]
[im 1/1]
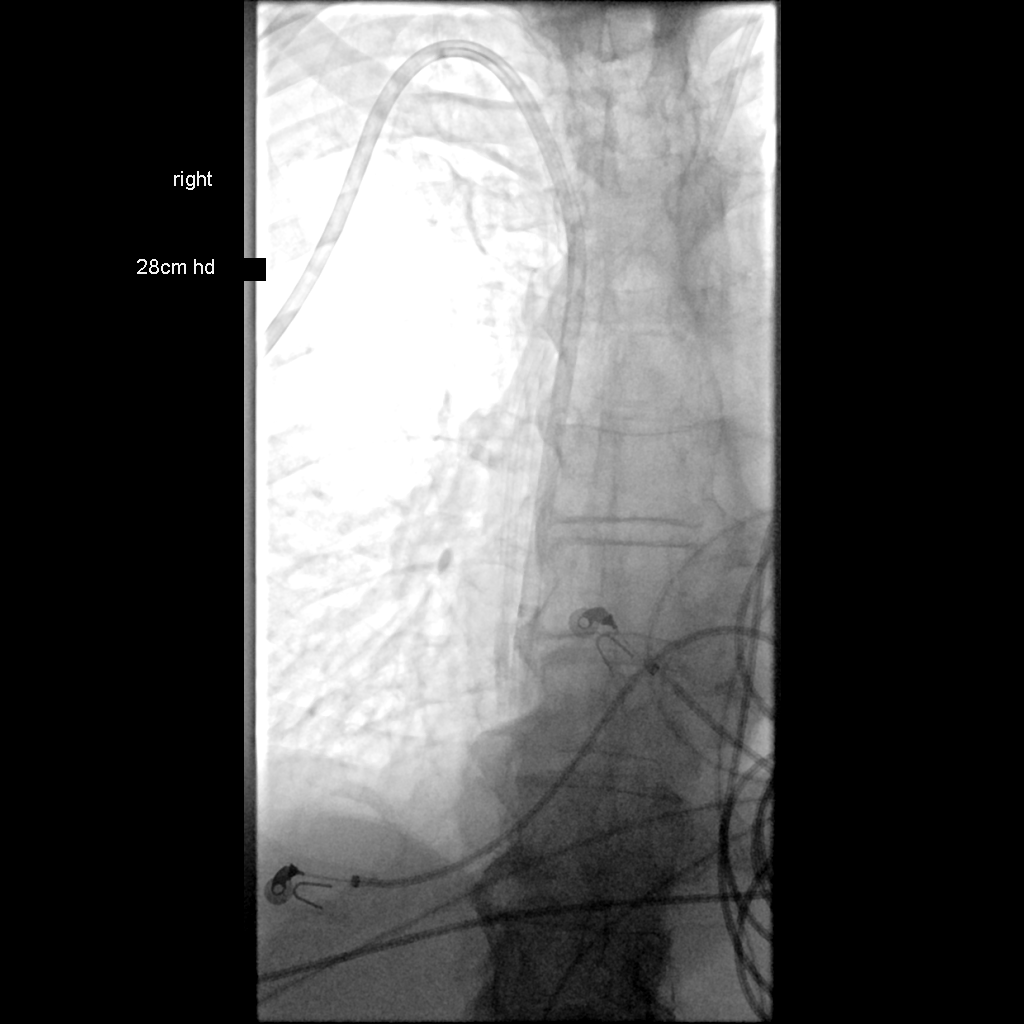

[1 of 1 positions shown; findings below may reference images not displayed]

EXAM:
TUNNELED DIALYSIS CATHETER PLACEMENT, ULTRASOUND GUIDANCE FOR
VASCULAR ACCESS

MEDICATIONS:
Ancef; The antibiotic was administered within an appropriate time
interval prior to skin puncture.

ANESTHESIA/SEDATION:
Versed 1 mg IV; Fentanyl 50 mcg IV;

Moderate Sedation Time:  15

The patient was continuously monitored during the procedure by the
interventional radiology nurse under my direct supervision.

FLUOROSCOPY TIME:  Fluoroscopy Time: 2 minutes 42 seconds (21 mGy).

COMPLICATIONS:
None immediate.

PROCEDURE:
Informed written consent was obtained from the patient after a
thorough discussion of the procedural risks, benefits and
alternatives. All questions were addressed. Maximal Sterile Barrier
Technique was utilized including caps, mask, sterile gowns, sterile
gloves, sterile drape, hand hygiene and skin antiseptic. A timeout
was performed prior to the initiation of the procedure.

The right neck was prepped with ChloraPrep in a sterile fashion, and
a sterile drape was applied covering the operative field. A sterile
gown and sterile gloves were used for the procedure. 1% lidocaine
into the skin and subcutaneous tissue. The right jugular vein was
noted to be patent initially with ultrasound. Under sonographic
guidance, a micropuncture needle was inserted into the right IJ vein
(Ultrasound and fluoroscopic image documentation was performed). It
was removed over an 018 wire which was up-sized to an Amplatz. This
was advanced into the IVC.

A small incision was made in the right upper chest. The tunneling
device was utilized to advance the 28 centimeter tip to cuff
catheter from the chest incision and out the neck incision. A
peel-away sheath was advanced over the Amplatz wire. The leading
edge of the catheter was then advanced through the peel-away sheath.
The peel-away sheath was removed. It was flushed and instilled with
heparin. The chest incision was closed with a 0 Prolene pursestring
stitch. The neck incision was closed with a 4-0 Vicryl subcuticular
stitch.
IMPRESSION: Successful right IJ vein tunneled dialysis catheter with its tip in
the right atrium.
